# Patient Record
Sex: Female | Born: 1985
Health system: Southern US, Community
[De-identification: ages and names within clinical notes are randomized; demographics above are authoritative.]

## PROBLEM LIST (undated history)

## (undated) DIAGNOSIS — Z789 Other specified health status: Secondary | ICD-10-CM

## (undated) DIAGNOSIS — M7500 Adhesive capsulitis of unspecified shoulder: Secondary | ICD-10-CM

## (undated) DIAGNOSIS — O1423 HELLP syndrome (HELLP), third trimester: Secondary | ICD-10-CM

## (undated) HISTORY — DX: Adhesive capsulitis of unspecified shoulder: M75.00

## (undated) HISTORY — PX: CRYOTHERAPY: SHX1416

---

## 2015-06-28 LAB — OB RESULTS CONSOLE RPR: RPR: NONREACTIVE

## 2015-06-28 LAB — OB RESULTS CONSOLE ABO/RH: RH Type: POSITIVE

## 2015-06-28 LAB — OB RESULTS CONSOLE HIV ANTIBODY (ROUTINE TESTING): HIV: NONREACTIVE

## 2015-06-28 LAB — OB RESULTS CONSOLE RUBELLA ANTIBODY, IGM: Rubella: IMMUNE

## 2015-06-28 LAB — OB RESULTS CONSOLE HEPATITIS B SURFACE ANTIGEN: Hepatitis B Surface Ag: NEGATIVE

## 2015-12-12 ENCOUNTER — Inpatient Hospital Stay (HOSPITAL_COMMUNITY)
Admission: AD | Admit: 2015-12-12 | Discharge: 2015-12-15 | DRG: 765 | Disposition: A | Discharge status: Home / Self Care | Payer: BLUE CROSS/BLUE SHIELD | Source: Ambulatory Visit | Attending: Obstetrics & Gynecology | Admitting: Obstetrics & Gynecology

## 2015-12-12 ENCOUNTER — Inpatient Hospital Stay (HOSPITAL_COMMUNITY): Payer: BLUE CROSS/BLUE SHIELD | Admitting: Anesthesiology

## 2015-12-12 ENCOUNTER — Encounter (HOSPITAL_COMMUNITY): Payer: Self-pay | Admitting: *Deleted

## 2015-12-12 ENCOUNTER — Encounter (HOSPITAL_COMMUNITY)
Admission: AD | Disposition: A | Discharge status: Home / Self Care | Payer: Self-pay | Source: Ambulatory Visit | Attending: Obstetrics & Gynecology

## 2015-12-12 DIAGNOSIS — Z833 Family history of diabetes mellitus: Secondary | ICD-10-CM | POA: Diagnosis not present

## 2015-12-12 DIAGNOSIS — O30043 Twin pregnancy, dichorionic/diamniotic, third trimester: Secondary | ICD-10-CM | POA: Diagnosis present

## 2015-12-12 DIAGNOSIS — O328XX2 Maternal care for other malpresentation of fetus, fetus 2: Secondary | ICD-10-CM | POA: Diagnosis present

## 2015-12-12 DIAGNOSIS — O9902 Anemia complicating childbirth: Secondary | ICD-10-CM | POA: Diagnosis present

## 2015-12-12 DIAGNOSIS — O1424 HELLP syndrome, complicating childbirth: Principal | ICD-10-CM | POA: Diagnosis present

## 2015-12-12 DIAGNOSIS — Z3A35 35 weeks gestation of pregnancy: Secondary | ICD-10-CM

## 2015-12-12 DIAGNOSIS — O1423 HELLP syndrome (HELLP), third trimester: Secondary | ICD-10-CM | POA: Diagnosis present

## 2015-12-12 HISTORY — DX: Other specified health status: Z78.9

## 2015-12-12 LAB — COMPREHENSIVE METABOLIC PANEL
ALT: 266 U/L — ABNORMAL HIGH (ref 14–54)
ALT: 195 U/L — ABNORMAL HIGH (ref 14–54)
AST: 244 U/L — ABNORMAL HIGH (ref 15–41)
AST: 163 U/L — ABNORMAL HIGH (ref 15–41)
Albumin: 2.8 g/dL — ABNORMAL LOW (ref 3.5–5.0)
Albumin: 2.3 g/dL — ABNORMAL LOW (ref 3.5–5.0)
Alkaline Phosphatase: 172 U/L — ABNORMAL HIGH (ref 38–126)
Alkaline Phosphatase: 136 U/L — ABNORMAL HIGH (ref 38–126)
Anion gap: 8 (ref 5–15)
Anion gap: 9 (ref 5–15)
BUN: 9 mg/dL (ref 6–20)
BUN: 7 mg/dL (ref 6–20)
CO2: 20 mmol/L — ABNORMAL LOW (ref 22–32)
CO2: 19 mmol/L — ABNORMAL LOW (ref 22–32)
Calcium: 8.4 mg/dL — ABNORMAL LOW (ref 8.9–10.3)
Calcium: 8.1 mg/dL — ABNORMAL LOW (ref 8.9–10.3)
Chloride: 106 mmol/L (ref 101–111)
Chloride: 110 mmol/L (ref 101–111)
Creatinine, Ser: 0.76 mg/dL (ref 0.44–1.00)
Creatinine, Ser: 0.7 mg/dL (ref 0.44–1.00)
GFR calc Af Amer: >60 mL/min (ref >60)
GFR calc Af Amer: >60 mL/min (ref >60)
GFR calc non Af Amer: >60 mL/min (ref >60)
GFR calc non Af Amer: >60 mL/min (ref >60)
Glucose, Bld: 84 mg/dL (ref 65–99)
Glucose, Bld: 148 mg/dL — ABNORMAL HIGH (ref 65–99)
Potassium: 3.6 mmol/L (ref 3.5–5.1)
Potassium: 3.6 mmol/L (ref 3.5–5.1)
Sodium: 134 mmol/L — ABNORMAL LOW (ref 135–145)
Sodium: 138 mmol/L (ref 135–145)
Total Bilirubin: 0.6 mg/dL (ref 0.3–1.2)
Total Bilirubin: 0.4 mg/dL (ref 0.3–1.2)
Total Protein: 6.1 g/dL — ABNORMAL LOW (ref 6.5–8.1)
Total Protein: 5.2 g/dL — ABNORMAL LOW (ref 6.5–8.1)

## 2015-12-12 LAB — PROTEIN / CREATININE RATIO, URINE
Creatinine, Urine: 121 mg/dL
Protein Creatinine Ratio: 0.14 mg/mg{Cre} (ref 0.00–0.15)
Total Protein, Urine: 17 mg/dL

## 2015-12-12 LAB — CBC
HCT: 34.5 % — ABNORMAL LOW (ref 36.0–46.0)
HCT: 28.7 % — ABNORMAL LOW (ref 36.0–46.0)
Hemoglobin: 11.8 g/dL — ABNORMAL LOW (ref 12.0–15.0)
Hemoglobin: 9.8 g/dL — ABNORMAL LOW (ref 12.0–15.0)
MCH: 30.9 pg (ref 26.0–34.0)
MCH: 30.9 pg (ref 26.0–34.0)
MCHC: 34.2 g/dL (ref 30.0–36.0)
MCHC: 34.1 g/dL (ref 30.0–36.0)
MCV: 90.3 fL (ref 78.0–100.0)
MCV: 90.5 fL (ref 78.0–100.0)
Platelets: 61 10*3/uL — ABNORMAL LOW (ref 150–400)
Platelets: 102 10*3/uL — ABNORMAL LOW (ref 150–400)
RBC: 3.82 MIL/uL — ABNORMAL LOW (ref 3.87–5.11)
RBC: 3.17 MIL/uL — ABNORMAL LOW (ref 3.87–5.11)
RDW: 13.5 % (ref 11.5–15.5)
RDW: 13.5 % (ref 11.5–15.5)
WBC: 11 10*3/uL — ABNORMAL HIGH (ref 4.0–10.5)
WBC: 13.9 10*3/uL — ABNORMAL HIGH (ref 4.0–10.5)

## 2015-12-12 LAB — AMYLASE: Amylase: 35 U/L (ref 28–100)

## 2015-12-12 LAB — URIC ACID: Uric Acid, Serum: 6 mg/dL (ref 2.3–6.6)

## 2015-12-12 LAB — PREPARE RBC (CROSSMATCH)

## 2015-12-12 LAB — ABO/RH: ABO/RH(D): B POS

## 2015-12-12 SURGERY — Surgical Case
Anesthesia: General

## 2015-12-12 MED ORDER — CEFAZOLIN SODIUM-DEXTROSE 2-4 GM/100ML-% IV SOLN
2.0000 g | INTRAVENOUS | Status: AC
  Administered 2015-12-12: 2 g via INTRAVENOUS
  Filled 2015-12-12: qty 100

## 2015-12-12 MED ORDER — FAMOTIDINE IN NACL 20-0.9 MG/50ML-% IV SOLN
20.0000 mg | Freq: Once | INTRAVENOUS | Status: AC
  Administered 2015-12-12: 20 mg via INTRAVENOUS
  Filled 2015-12-12: qty 50

## 2015-12-12 MED ORDER — SODIUM CHLORIDE 0.9 % IR SOLN
Status: DC | PRN
  Administered 2015-12-12: 1000 mL

## 2015-12-12 MED ORDER — OXYCODONE HCL 5 MG PO TABS
5.0000 mg | ORAL_TABLET | ORAL | Status: DC | PRN
  Administered 2015-12-13 – 2015-12-15 (×5): 5 mg via ORAL
  Filled 2015-12-12 (×6): qty 1

## 2015-12-12 MED ORDER — HYDROMORPHONE HCL 1 MG/ML IJ SOLN
0.2500 mg | INTRAMUSCULAR | Status: DC | PRN
  Administered 2015-12-12 (×2): 0.5 mg via INTRAVENOUS

## 2015-12-12 MED ORDER — SODIUM CHLORIDE 0.9 % IV SOLN
INTRAVENOUS | Status: DC | PRN
  Administered 2015-12-12 (×2): via INTRAVENOUS

## 2015-12-12 MED ORDER — BETAMETHASONE SOD PHOS & ACET 6 (3-3) MG/ML IJ SUSP
12.0000 mg | Freq: Once | INTRAMUSCULAR | Status: AC
  Administered 2015-12-12: 12 mg via INTRAMUSCULAR
  Filled 2015-12-12: qty 2

## 2015-12-12 MED ORDER — SODIUM CHLORIDE 0.9 % IJ SOLN
INTRAMUSCULAR | Status: AC
  Filled 2015-12-12: qty 10

## 2015-12-12 MED ORDER — OXYTOCIN 10 UNIT/ML IJ SOLN: 2.5000 [IU]/h | INTRAVENOUS | Status: AC

## 2015-12-12 MED ORDER — HYDROMORPHONE HCL 1 MG/ML IJ SOLN
INTRAMUSCULAR | Status: AC
  Administered 2015-12-12: 0.5 mg via INTRAVENOUS
  Filled 2015-12-12: qty 1

## 2015-12-12 MED ORDER — MAGNESIUM SULFATE 50 % IJ SOLN
2.0000 g/h | INTRAVENOUS | Status: DC
  Filled 2015-12-12: qty 80

## 2015-12-12 MED ORDER — FENTANYL CITRATE (PF) 250 MCG/5ML IJ SOLN
INTRAMUSCULAR | Status: AC
  Filled 2015-12-12: qty 5

## 2015-12-12 MED ORDER — CITRIC ACID-SODIUM CITRATE 334-500 MG/5ML PO SOLN
30.0000 mL | Freq: Once | ORAL | Status: AC
  Administered 2015-12-12: 30 mL via ORAL
  Filled 2015-12-12: qty 15

## 2015-12-12 MED ORDER — CARBOPROST TROMETHAMINE 250 MCG/ML IM SOLN
INTRAMUSCULAR | Status: DC | PRN
  Administered 2015-12-12: 250 ug via INTRAMUSCULAR

## 2015-12-12 MED ORDER — DIBUCAINE 1 % RE OINT: 1.0000 "application " | TOPICAL_OINTMENT | RECTAL | Status: DC | PRN

## 2015-12-12 MED ORDER — OXYTOCIN 10 UNIT/ML IJ SOLN
INTRAMUSCULAR | Status: AC
  Filled 2015-12-12: qty 4

## 2015-12-12 MED ORDER — SCOPOLAMINE 1 MG/3DAYS TD PT72
MEDICATED_PATCH | TRANSDERMAL | Status: AC
  Filled 2015-12-12: qty 1

## 2015-12-12 MED ORDER — DEXAMETHASONE SODIUM PHOSPHATE 4 MG/ML IJ SOLN
INTRAMUSCULAR | Status: AC
  Filled 2015-12-12: qty 1

## 2015-12-12 MED ORDER — OXYTOCIN 10 UNIT/ML IJ SOLN
INTRAMUSCULAR | Status: AC
  Filled 2015-12-12: qty 1

## 2015-12-12 MED ORDER — PROPOFOL 10 MG/ML IV BOLUS
INTRAVENOUS | Status: DC | PRN
  Administered 2015-12-12: 150 mg via INTRAVENOUS

## 2015-12-12 MED ORDER — WITCH HAZEL-GLYCERIN EX PADS: 1.0000 "application " | MEDICATED_PAD | CUTANEOUS | Status: DC | PRN

## 2015-12-12 MED ORDER — ONDANSETRON HCL 4 MG/2ML IJ SOLN
INTRAMUSCULAR | Status: DC | PRN
  Administered 2015-12-12: 4 mg via INTRAVENOUS

## 2015-12-12 MED ORDER — SCOPOLAMINE 1 MG/3DAYS TD PT72
MEDICATED_PATCH | TRANSDERMAL | Status: DC | PRN
  Administered 2015-12-12: 1 via TRANSDERMAL

## 2015-12-12 MED ORDER — MEPERIDINE HCL 25 MG/ML IJ SOLN: 6.2500 mg | INTRAMUSCULAR | Status: DC | PRN

## 2015-12-12 MED ORDER — MAGNESIUM SULFATE BOLUS VIA INFUSION
4.0000 g | Freq: Once | INTRAVENOUS | Status: AC
  Administered 2015-12-12: 4 g via INTRAVENOUS
  Filled 2015-12-12: qty 500

## 2015-12-12 MED ORDER — SUCCINYLCHOLINE CHLORIDE 20 MG/ML IJ SOLN
INTRAMUSCULAR | Status: DC | PRN
  Administered 2015-12-12: 140 mg via INTRAVENOUS

## 2015-12-12 MED ORDER — MENTHOL 3 MG MT LOZG
1.0000 | LOZENGE | OROMUCOSAL | Status: DC | PRN
  Filled 2015-12-12: qty 9

## 2015-12-12 MED ORDER — SENNOSIDES-DOCUSATE SODIUM 8.6-50 MG PO TABS
2.0000 | ORAL_TABLET | ORAL | Status: DC
  Administered 2015-12-12: 2 via ORAL
  Filled 2015-12-12 (×3): qty 2

## 2015-12-12 MED ORDER — IBUPROFEN 600 MG PO TABS
600.0000 mg | ORAL_TABLET | Freq: Four times a day (QID) | ORAL | Status: DC
  Administered 2015-12-12 – 2015-12-15 (×9): 600 mg via ORAL
  Filled 2015-12-12 (×10): qty 1

## 2015-12-12 MED ORDER — LIDOCAINE HCL (CARDIAC) 20 MG/ML IV SOLN
INTRAVENOUS | Status: AC
  Filled 2015-12-12: qty 5

## 2015-12-12 MED ORDER — LANOLIN HYDROUS EX OINT: 1.0000 "application " | TOPICAL_OINTMENT | CUTANEOUS | Status: DC | PRN

## 2015-12-12 MED ORDER — FENTANYL CITRATE (PF) 100 MCG/2ML IJ SOLN
INTRAMUSCULAR | Status: DC | PRN
  Administered 2015-12-12 (×3): 50 ug via INTRAVENOUS
  Administered 2015-12-12: 100 ug via INTRAVENOUS

## 2015-12-12 MED ORDER — TETANUS-DIPHTH-ACELL PERTUSSIS 5-2.5-18.5 LF-MCG/0.5 IM SUSP
0.5000 mL | Freq: Once | INTRAMUSCULAR | Status: DC
Start: 2015-12-13 — End: 2015-12-14
  Filled 2015-12-12: qty 0.5

## 2015-12-12 MED ORDER — LACTATED RINGERS IV SOLN
INTRAVENOUS | Status: DC
  Administered 2015-12-12: 14:00:00 via INTRAVENOUS

## 2015-12-12 MED ORDER — SIMETHICONE 80 MG PO CHEW: 80.0000 mg | CHEWABLE_TABLET | ORAL | Status: DC | PRN

## 2015-12-12 MED ORDER — LACTATED RINGERS IV SOLN
INTRAVENOUS | Status: DC
Start: 2015-12-12 — End: 2015-12-14
  Administered 2015-12-12 – 2015-12-13 (×2): 125 mL/h via INTRAVENOUS

## 2015-12-12 MED ORDER — MIDAZOLAM HCL 2 MG/2ML IJ SOLN
INTRAMUSCULAR | Status: AC
  Filled 2015-12-12: qty 2

## 2015-12-12 MED ORDER — ONDANSETRON HCL 4 MG/2ML IJ SOLN
INTRAMUSCULAR | Status: AC
  Filled 2015-12-12: qty 2

## 2015-12-12 MED ORDER — OXYTOCIN 10 UNIT/ML IJ SOLN
40.0000 [IU] | INTRAVENOUS | Status: DC | PRN
  Administered 2015-12-12: 40 [IU] via INTRAVENOUS

## 2015-12-12 MED ORDER — DIPHENHYDRAMINE HCL 25 MG PO CAPS
25.0000 mg | ORAL_CAPSULE | Freq: Four times a day (QID) | ORAL | Status: DC | PRN
  Filled 2015-12-12: qty 1

## 2015-12-12 MED ORDER — DEXAMETHASONE SODIUM PHOSPHATE 10 MG/ML IJ SOLN
INTRAMUSCULAR | Status: DC | PRN
  Administered 2015-12-12: 4 mg via INTRAVENOUS

## 2015-12-12 MED ORDER — HYDROMORPHONE HCL 1 MG/ML IJ SOLN
0.5000 mg | INTRAMUSCULAR | Status: AC | PRN
  Administered 2015-12-13: 0.5 mg via INTRAVENOUS
  Filled 2015-12-12: qty 1

## 2015-12-12 MED ORDER — PRENATAL MULTIVITAMIN CH
1.0000 | ORAL_TABLET | Freq: Every day | ORAL | Status: DC
  Administered 2015-12-13 – 2015-12-14 (×2): 1 via ORAL
  Filled 2015-12-12 (×2): qty 1

## 2015-12-12 MED ORDER — LACTATED RINGERS IV BOLUS (SEPSIS)
1000.0000 mL | Freq: Once | INTRAVENOUS | Status: AC
  Administered 2015-12-12: 1000 mL via INTRAVENOUS

## 2015-12-12 MED ORDER — SIMETHICONE 80 MG PO CHEW
80.0000 mg | CHEWABLE_TABLET | Freq: Three times a day (TID) | ORAL | Status: DC
  Administered 2015-12-13 – 2015-12-15 (×8): 80 mg via ORAL
  Filled 2015-12-12 (×6): qty 1

## 2015-12-12 MED ORDER — SUCCINYLCHOLINE CHLORIDE 20 MG/ML IJ SOLN
INTRAMUSCULAR | Status: AC
  Filled 2015-12-12: qty 1

## 2015-12-12 MED ORDER — SIMETHICONE 80 MG PO CHEW
80.0000 mg | CHEWABLE_TABLET | ORAL | Status: DC
  Administered 2015-12-12: 80 mg via ORAL
  Filled 2015-12-12 (×4): qty 1

## 2015-12-12 MED ORDER — SODIUM CHLORIDE 0.9 % IV SOLN
Freq: Once | INTRAVENOUS | Status: AC
  Administered 2015-12-12: 13:00:00 via INTRAVENOUS

## 2015-12-12 MED ORDER — MIDAZOLAM HCL 2 MG/2ML IJ SOLN
INTRAMUSCULAR | Status: DC | PRN
Start: 2015-12-12 — End: 2015-12-12
  Administered 2015-12-12: 2 mg via INTRAVENOUS

## 2015-12-12 MED ORDER — LACTATED RINGERS IV SOLN
INTRAVENOUS | Status: DC | PRN
  Administered 2015-12-12: 14:00:00 via INTRAVENOUS

## 2015-12-12 MED ORDER — ZOLPIDEM TARTRATE 5 MG PO TABS: 5.0000 mg | ORAL_TABLET | Freq: Every evening | ORAL | Status: DC | PRN

## 2015-12-12 MED ORDER — PROPOFOL 10 MG/ML IV BOLUS
INTRAVENOUS | Status: AC
  Filled 2015-12-12: qty 20

## 2015-12-12 SURGICAL SUPPLY — 37 items
BENZOIN TINCTURE PRP APPL 2/3 (GAUZE/BANDAGES/DRESSINGS) ×2 IMPLANT
CHLORAPREP W/TINT 26ML (MISCELLANEOUS) ×2 IMPLANT
CLAMP CORD UMBIL (MISCELLANEOUS) IMPLANT
CLOTH BEACON ORANGE TIMEOUT ST (SAFETY) ×2 IMPLANT
CONTAINER PREFILL 10% NBF 15ML (MISCELLANEOUS) IMPLANT
DRSG OPSITE POSTOP 4X10 (GAUZE/BANDAGES/DRESSINGS) ×2 IMPLANT
ELECT REM PT RETURN 9FT ADLT (ELECTROSURGICAL) ×2
ELECTRODE REM PT RTRN 9FT ADLT (ELECTROSURGICAL) ×1 IMPLANT
EXTRACTOR VACUUM KIWI (MISCELLANEOUS) IMPLANT
EXTRACTOR VACUUM M CUP 4 TUBE (SUCTIONS) IMPLANT
GLOVE BIO SURGEON STRL SZ7 (GLOVE) ×2 IMPLANT
GLOVE BIOGEL PI IND STRL 7.0 (GLOVE) ×3 IMPLANT
GLOVE BIOGEL PI INDICATOR 7.0 (GLOVE) ×3
GOWN STRL REUS W/TWL LRG LVL3 (GOWN DISPOSABLE) ×4 IMPLANT
KIT ABG SYR 3ML LUER SLIP (SYRINGE) IMPLANT
NEEDLE HYPO 25X5/8 SAFETYGLIDE (NEEDLE) IMPLANT
NS IRRIG 1000ML POUR BTL (IV SOLUTION) ×2 IMPLANT
PACK C SECTION WH (CUSTOM PROCEDURE TRAY) ×2 IMPLANT
PAD ABD 8X7 1/2 STERILE (GAUZE/BANDAGES/DRESSINGS) ×2 IMPLANT
PAD OB MATERNITY 4.3X12.25 (PERSONAL CARE ITEMS) ×2 IMPLANT
RTRCTR C-SECT PINK 25CM LRG (MISCELLANEOUS) ×2 IMPLANT
SPONGE GAUZE 4X4 12PLY STER LF (GAUZE/BANDAGES/DRESSINGS) ×4 IMPLANT
STRIP CLOSURE SKIN 1/2X4 (GAUZE/BANDAGES/DRESSINGS) ×2 IMPLANT
SUT MON AB-0 CT1 36 (SUTURE) ×8 IMPLANT
SUT PLAIN 0 NONE (SUTURE) IMPLANT
SUT PLAIN 2 0 (SUTURE) ×1
SUT PLAIN 2 0 XLH (SUTURE) ×2 IMPLANT
SUT PLAIN ABS 2-0 CT1 27XMFL (SUTURE) ×1 IMPLANT
SUT VIC AB 0 CT1 27 (SUTURE) ×2
SUT VIC AB 0 CT1 27XBRD ANBCTR (SUTURE) ×2 IMPLANT
SUT VIC AB 2-0 CT1 27 (SUTURE) ×3
SUT VIC AB 2-0 CT1 TAPERPNT 27 (SUTURE) ×3 IMPLANT
SUT VIC AB 4-0 KS 27 (SUTURE) IMPLANT
SUT VICRYL 0 TIES 12 18 (SUTURE) IMPLANT
TAPE CLOTH SURG 4X10 WHT LF (GAUZE/BANDAGES/DRESSINGS) ×2 IMPLANT
TOWEL OR 17X24 6PK STRL BLUE (TOWEL DISPOSABLE) ×2 IMPLANT
TRAY FOLEY CATH SILVER 14FR (SET/KITS/TRAYS/PACK) IMPLANT

## 2015-12-12 NOTE — Anesthesia Procedure Notes (Signed)
Procedure Name: Intubation Date/Time: 12/12/2015 2:15 PM Performed by: Yolonda KidaARVER, Odis Turck L Pre-anesthesia Checklist: Patient identified, Emergency Drugs available, Suction available and Patient being monitored Patient Re-evaluated:Patient Re-evaluated prior to inductionOxygen Delivery Method: Circle system utilized Preoxygenation: Pre-oxygenation with 100% oxygen Intubation Type: IV induction, Rapid sequence and Cricoid Pressure applied Laryngoscope Size: Glidescope and 3 Grade View: Grade I Tube type: Oral Tube size: 7.0 mm Number of attempts: 1 Airway Equipment and Method: Stylet and Video-laryngoscopy Placement Confirmation: ETT inserted through vocal cords under direct vision,  positive ETCO2,  CO2 detector and breath sounds checked- equal and bilateral Secured at: 21 cm Tube secured with: Tape Dental Injury: Teeth and Oropharynx as per pre-operative assessment

## 2015-12-12 NOTE — MAU Note (Signed)
Sharp pains rt side lower ribcage, last few nights, off and on. Shooting up into chest.  Are calming down now. Spotting noted with internal.

## 2015-12-12 NOTE — MAU Note (Signed)
House coverage and OR -coordinator notified, Data processing managercirculator and anes

## 2015-12-12 NOTE — Transfer of Care (Signed)
Immediate Anesthesia Transfer of Care Note  Patient: Lauren Robertson  Procedure(s) Performed: Procedure(s): CESAREAN SECTION (N/A)  Patient Location: PACU  Anesthesia Type:General  Level of Consciousness: awake, alert  and oriented  Airway & Oxygen Therapy: Patient Spontanous Breathing and Patient connected to nasal cannula oxygen  Post-op Assessment: Report given to RN and Post -op Vital signs reviewed and stable  Post vital signs: Reviewed and stable  Last Vitals:  Filed Vitals:   12/12/15 1332 12/12/15 1352  BP: 135/82 142/84  Pulse: 96 105  Temp: 37.2 C 37.6 C  Resp: 18 18    Complications: No apparent anesthesia complications

## 2015-12-12 NOTE — Progress Notes (Addendum)
[facility] from [facility] - assessment reviewed / seen in office by Dr Deatra JamesMody  Contacted Dr Juliene PinaMody - [facility]  Notify MD on-call with results   Marlinda Mikeanya Abdifatah Colquhoun CNM, [facility]

## 2015-12-12 NOTE — MAU Note (Signed)
Spoke with Dr. Cherly Hensenousins, she is in OR and cannot see patient at this time.  Dr. Cherly Hensenousins states for me to call Dr. Juliene PinaMody with results.

## 2015-12-12 NOTE — Op Note (Signed)
Cesarean Section Procedure Note  Cierah P Mogel  12/12/2015  Indications: HELLP Syndrome, Dichorionic twins, 35 weeks    Procedure: Primary Low Transverse Cesarean section                     (Double layer closure of hysterotomy)  Pre-operative Diagnosis: HELLP Syndrome, Ttwins, 35 weeks .   Post-operative Diagnosis: Same   Surgeon:  Shea EvansVaishali Iven Earnhart, MD   Assistants: Marlinda Mikeanya Bailey, CNM   Anesthesia: General endotracheal  Procedure Details:  The patient was seen in the Triage Room, she was sent over from office. She is 35 wks with Diamniotic/Dichorionic twins who was seen in the office for increased swelling, not feeling well,right chest shooting pain with contractions. Office BP was 142/72 and was sent over for Preeclampsia evaluation and to monitor for preterm labor. She was diagnosed with HELLP syndrome with elevated liver enzymes in 200s and Platelet count at 62. She was remote from delivery with no prior vaginal deliveries and expedited delivery was indicated. She was counseled on platelet transfusion and Cesarean delivery. She was given one dose of Betamethasone for fetal lung maturity/  The risks, benefits, complications, treatment options, and expected outcomes were discussed with the patient. The patient concurred with the proposed plan, giving informed consent. identified as Yuritzi P Varon and the procedure verified as C-Section Delivery.  She was brought to the Operating room. A Time Out was held and the above information confirmed. One platelet unit transfusion started and 2nd was available that was transfused as the surgery ended. 2 gm Ancef started. She had foley placed and prepping and draping was done. Then she underwent general anesthesia and intubation was done as which point I was asked to start surgery.  A Pfannenstiel incision was made and carried down through the subcutaneous tissue to the fascia. Fascial incision was made and extended transversely. The fascia was separated  from the underlying rectus tissue superiorly and inferiorly. The peritoneum was identified and entered. Peritoneal incision was extended longitudinally. Alexis-O tractor was placed The utero-vesical peritoneal reflection was incised transversely and the bladder flap was bluntly freed from the lower uterine segment. A low transverse uterine incision was made. Baby A- BOY was cephalic. Amniotomy revealed clear fluid. Baby A was delivered from cephalic presentation, cord clamped and cut and baby handed to NICU team. Apgar scores of 8 at one minute and 9 at five minutes. Twin B was Footling breech. Amniotomy done and clear copious fluid drained. Twin B- GIRL was delivered via complete breech extraction, cord clamped and cut and baby handed to NICU team. Apgars 7 and 9 at 1 and 5 minutes. Cord ph was not sent. Umbilical cord blood was obtained for evaluation after placentas removed. The placenta was removed Intact and appeared normal. The uterine outline, tubes and ovaries appeared normal. Patient was given 1 dose of IM Hemabate to prevent uterine atony from general anesthesia and twins. The uterine incision was closed with running locked sutures of 0Monocryl followed by a second imbricating layer. Hemostasis was observed. Alexis retractor removed. Peritoneal closure done with 2-0 Vicryl. The fascia was then reapproximated with running sutures of 0Vicryl. The subcuticular closure was performed using 2-0plain gut. The skin was closed with 4-0Vicryl. Pressure dressing and sterile dressings placed.   Instrument, sponge, and needle counts were correct prior the abdominal closure and were correct at the conclusion of the case.   Findings:  Low transverse C-section, 2 layer closure. Twin A - BOY, cephalic delivery. Twin B -  girl, breech extraction. Clear amniotic fluid for each. Normal placentas. Normal uterus, tubes, ovaries.    Estimated Blood Loss: 1200 cc   Total IV Fluids: 3500 cc LR  Urine Output: 150CC OF  clear urine  Specimens: cord blood, placenta   Complications: no complications  Disposition: PACU - hemodynamically stable.   Maternal Condition: stable but transfer to ICU for magnesium and close monitoring   Baby condition / location:  Couplet care / Skin to Skin  Attending Attestation: I performed the procedure.   Signed: Surgeon(s): Shea Evans, MD

## 2015-12-12 NOTE — Anesthesia Preprocedure Evaluation (Signed)
Anesthesia Evaluation  Patient identified by MRN, date of birth, ID band Patient awake    Reviewed: Allergy & Precautions, NPO status , Patient's Chart, lab work & pertinent test results  Airway Mallampati: II  TM Distance: >3 FB Neck ROM: Full    Dental  (+) Teeth Intact, Dental Advisory Given   Pulmonary neg pulmonary ROS,    Pulmonary exam normal        Cardiovascular Normal cardiovascular exam     Neuro/Psych negative neurological ROS  negative psych ROS   GI/Hepatic negative GI ROS, Neg liver ROS,   Endo/Other  negative endocrine ROS  Renal/GU negative Renal ROS     Musculoskeletal   Abdominal   Peds  Hematology negative hematology ROS (+)   Anesthesia Other Findings   Reproductive/Obstetrics (+) Pregnancy                             Anesthesia Physical Anesthesia Plan  ASA: III and emergent  Anesthesia Plan: General   Post-op Pain Management:    Induction: Intravenous  Airway Management Planned: Oral ETT  Additional Equipment:   Intra-op Plan:   Post-operative Plan: Extubation in OR  Informed Consent: I have reviewed the patients History and Physical, chart, labs and discussed the procedure including the risks, benefits and alternatives for the proposed anesthesia with the patient or authorized representative who has indicated his/her understanding and acceptance.   Dental advisory given  Plan Discussed with: CRNA and Anesthesiologist  Anesthesia Plan Comments:         Anesthesia Quick Evaluation

## 2015-12-12 NOTE — Anesthesia Postprocedure Evaluation (Signed)
Anesthesia Post Note  Patient: Lauren Robertson  Procedure(s) Performed: Procedure(s) (LRB): CESAREAN SECTION (N/A)  Patient location during evaluation: PACU Anesthesia Type: General Level of consciousness: sedated Pain management: pain level controlled Vital Signs Assessment: post-procedure vital signs reviewed and stable Respiratory status: spontaneous breathing and respiratory function stable Cardiovascular status: stable Anesthetic complications: no    Last Vitals:  Filed Vitals:   12/12/15 1545 12/12/15 1600  BP: 139/78 118/93  Pulse: 65 73  Temp:    Resp: 13 18    Last Pain:  Filed Vitals:   12/12/15 1605  PainSc: 5                  Alekai Pocock DANIEL

## 2015-12-12 NOTE — H&P (Signed)
Carmeline P Darling is a 30 y.o. female presenting for RUQ pain, swelling, elevated BP. MAU eval done, pt has HELLP syndrome.  DiDi twins, Femara preg. Uncomplicated. Last growth sono at 34 wks, both about 6 lbs.  Healthy mother.   History OB History    Gravida Para Term Preterm AB TAB SAB Ectopic Multiple Living   1              Past Medical History  Diagnosis Date  . Medical history non-contributory    Past Surgical History  Procedure Laterality Date  . Cryotherapy      cervix   Family History: family history includes Cancer in her paternal grandmother; Diabetes in her paternal grandmother. Social History:  reports that she has never smoked. She does not have any smokeless tobacco history on file. She reports that she does not drink alcohol or use illicit drugs.   Prenatal Transfer Tool  Maternal Diabetes: No Genetic Screening: Normal Maternal Ultrasounds/Referrals: Normal Fetal Ultrasounds or other Referrals:  None Maternal Substance Abuse:  No Significant Maternal Medications:  None Significant Maternal Lab Results:  Lab values include: Group B Strep negative Other Comments:  None  ROS above    Blood pressure 134/74, pulse 82, temperature 98.9 F (37.2 C), temperature source Oral, resp. rate 20, weight 210 lb (95.255 kg), SpO2 97 %.  Exam Physical Exam  A&O x 3, no acute distress. Pleasant HEENT neg, no thyromegaly Lungs CTA bilat CV RRR, S1S2 normal Abdo soft, non tender, non acute Extr +3 edema/ no tenderness/ DTR +2 Pelvic 2/50%/-2 FHT A 130s/ reactive, cateogory I, B 140s/ reactive, category I Toco irreg UCs  Prenatal labs: ABO, Rh: --/--/B POS (04/03 1049) Antibody: PENDING (04/03 1049) Rubella:   RPR:   NR HBsAg:   Neg HIV:   Neg GBS:   Neg  Assessment/Plan: Mercie Eoni Di twins, 35 wks, HELLP syndrome. Platelet pack 1 unit ASAP and C/section ASAP. 2nd platelet pack in OR if needed. T&C ready.  BMTZ stat one dose. NICU informed, accept Reviewed severe  PEC/HELLP, risks/complications, accepts transfusion/   Risks/complications of surgery reviewed incl infection, bleeding, damage to internal organs including bladder, bowels, ureters, blood vessels, other risks from anesthesia, VTE and delayed complications of any surgery, complications in future surgery reviewed. Also discussed neonatal complications incl difficult delivery, laceration, vacuum assistance, TTN etc. Pt understands and agrees, all concerns addressed.      Vonya Ohalloran R 12/12/2015, 12:52 PM

## 2015-12-13 ENCOUNTER — Encounter (HOSPITAL_COMMUNITY): Payer: Self-pay | Admitting: *Deleted

## 2015-12-13 LAB — COMPREHENSIVE METABOLIC PANEL
ALT: 154 U/L — ABNORMAL HIGH (ref 14–54)
ALT: 153 U/L — ABNORMAL HIGH (ref 14–54)
AST: 107 U/L — ABNORMAL HIGH (ref 15–41)
AST: 87 U/L — ABNORMAL HIGH (ref 15–41)
Albumin: 2.1 g/dL — ABNORMAL LOW (ref 3.5–5.0)
Albumin: 2.5 g/dL — ABNORMAL LOW (ref 3.5–5.0)
Alkaline Phosphatase: 126 U/L (ref 38–126)
Alkaline Phosphatase: 137 U/L — ABNORMAL HIGH (ref 38–126)
Anion gap: 6 (ref 5–15)
Anion gap: 6 (ref 5–15)
BUN: 7 mg/dL (ref 6–20)
BUN: 8 mg/dL (ref 6–20)
CO2: 23 mmol/L (ref 22–32)
CO2: 24 mmol/L (ref 22–32)
Calcium: 7.7 mg/dL — ABNORMAL LOW (ref 8.9–10.3)
Calcium: 7.5 mg/dL — ABNORMAL LOW (ref 8.9–10.3)
Chloride: 109 mmol/L (ref 101–111)
Chloride: 108 mmol/L (ref 101–111)
Creatinine, Ser: 0.79 mg/dL (ref 0.44–1.00)
Creatinine, Ser: 0.75 mg/dL (ref 0.44–1.00)
GFR calc Af Amer: >60 mL/min (ref >60)
GFR calc Af Amer: >60 mL/min (ref >60)
GFR calc non Af Amer: >60 mL/min (ref >60)
GFR calc non Af Amer: >60 mL/min (ref >60)
Glucose, Bld: 108 mg/dL — ABNORMAL HIGH (ref 65–99)
Glucose, Bld: 106 mg/dL — ABNORMAL HIGH (ref 65–99)
Potassium: 4.3 mmol/L (ref 3.5–5.1)
Potassium: 4.2 mmol/L (ref 3.5–5.1)
Sodium: 138 mmol/L (ref 135–145)
Sodium: 138 mmol/L (ref 135–145)
Total Bilirubin: 0.4 mg/dL (ref 0.3–1.2)
Total Bilirubin: 0.2 mg/dL — ABNORMAL LOW (ref 0.3–1.2)
Total Protein: 4.8 g/dL — ABNORMAL LOW (ref 6.5–8.1)
Total Protein: 5.8 g/dL — ABNORMAL LOW (ref 6.5–8.1)

## 2015-12-13 LAB — CBC
HCT: 26.4 % — ABNORMAL LOW (ref 36.0–46.0)
HCT: 27.1 % — ABNORMAL LOW (ref 36.0–46.0)
Hemoglobin: 9.1 g/dL — ABNORMAL LOW (ref 12.0–15.0)
Hemoglobin: 9.3 g/dL — ABNORMAL LOW (ref 12.0–15.0)
MCH: 31 pg (ref 26.0–34.0)
MCH: 31.1 pg (ref 26.0–34.0)
MCHC: 34.5 g/dL (ref 30.0–36.0)
MCHC: 34.3 g/dL (ref 30.0–36.0)
MCV: 89.8 fL (ref 78.0–100.0)
MCV: 90.6 fL (ref 78.0–100.0)
Platelets: 126 10*3/uL — ABNORMAL LOW (ref 150–400)
Platelets: 149 10*3/uL — ABNORMAL LOW (ref 150–400)
RBC: 2.94 MIL/uL — ABNORMAL LOW (ref 3.87–5.11)
RBC: 2.99 MIL/uL — ABNORMAL LOW (ref 3.87–5.11)
RDW: 13.7 % (ref 11.5–15.5)
RDW: 13.8 % (ref 11.5–15.5)
WBC: 14.6 10*3/uL — ABNORMAL HIGH (ref 4.0–10.5)
WBC: 14.9 10*3/uL — ABNORMAL HIGH (ref 4.0–10.5)

## 2015-12-13 LAB — RPR: RPR Ser Ql: NONREACTIVE

## 2015-12-13 LAB — PREPARE PLATELET PHERESIS
Unit division: 0
Unit division: 0

## 2015-12-13 LAB — MAGNESIUM: Magnesium: 4.6 mg/dL — ABNORMAL HIGH (ref 1.7–2.4)

## 2015-12-13 MED ORDER — SCOPOLAMINE 1 MG/3DAYS TD PT72
1.0000 | MEDICATED_PATCH | Freq: Once | TRANSDERMAL | Status: DC
  Filled 2015-12-13: qty 1

## 2015-12-13 MED ORDER — NALBUPHINE HCL 10 MG/ML IJ SOLN: 5.0000 mg | Freq: Once | INTRAMUSCULAR | Status: DC | PRN

## 2015-12-13 MED ORDER — NALBUPHINE HCL 10 MG/ML IJ SOLN: 5.0000 mg | INTRAMUSCULAR | Status: DC | PRN

## 2015-12-13 MED ORDER — DIPHENHYDRAMINE HCL 50 MG/ML IJ SOLN: 12.5000 mg | INTRAMUSCULAR | Status: DC | PRN

## 2015-12-13 MED ORDER — NALOXONE HCL 0.4 MG/ML IJ SOLN: 0.4000 mg | INTRAMUSCULAR | Status: DC | PRN

## 2015-12-13 MED ORDER — SODIUM CHLORIDE 0.9% FLUSH: 3.0000 mL | INTRAVENOUS | Status: DC | PRN

## 2015-12-13 MED ORDER — KETOROLAC TROMETHAMINE 30 MG/ML IJ SOLN
30.0000 mg | Freq: Four times a day (QID) | INTRAMUSCULAR | Status: DC | PRN
  Filled 2015-12-13: qty 1

## 2015-12-13 MED ORDER — NALOXONE HCL 2 MG/2ML IJ SOSY
1.0000 ug/kg/h | PREFILLED_SYRINGE | INTRAVENOUS | Status: DC | PRN
  Filled 2015-12-13: qty 2

## 2015-12-13 MED ORDER — DIPHENHYDRAMINE HCL 25 MG PO CAPS: 25.0000 mg | ORAL_CAPSULE | ORAL | Status: DC | PRN

## 2015-12-13 MED ORDER — ONDANSETRON HCL 4 MG/2ML IJ SOLN: 4.0000 mg | Freq: Three times a day (TID) | INTRAMUSCULAR | Status: DC | PRN

## 2015-12-13 NOTE — Progress Notes (Signed)
Patient ID: Lauren Robertson, female   DOB: 02/21/1986, 30 y.o.   MRN: [id number]  POD#1 C/section for HELLP Syndrome, 35 wks, Twins Twins - Boy/Girl - with mother.  Breast feeding  Rubella Immune   Subjective: Incisional pain, was dizzy when first got out. Swelling reduced. No HA/ SOB/ CP/ RUQ pain  Tolerated diet and has ambulated to BR to clean up. No side effects from magnesium  Objective: Vital signs in last 24 hours: Temp:  [97.6 F (36.4 C)-99.7 F (37.6 C)] 98.7 F (37.1 C) (04/04 0200) Pulse Rate:  [52-107] 71 (04/04 0703) Resp:  [12-28] 14 (04/04 0703) BP: (95-142)/(48-93) 104/70 mmHg (04/04 0703) SpO2:  [91 %-100 %] 95 % (04/04 0703) Weight:  [210 lb (95.255 kg)] 210 lb (95.255 kg) (04/03 1800) Weight change:   198 today, 12 lb wt loss since yesterday.   BP 104/70 mmHg  Pulse 71  Temp(Src) 98.7 F (37.1 C) (Oral)  Resp 14  Ht 5\' 2"  (1.575 m)  Wt 198 lb 3.2 oz (89.903 kg)  BMI 36.24 kg/m2  SpO2 95%  Breastfeeding? Unknown Intake/Output from previous day: 04/03 0701 - 04/04 0700 In: 6939.3 [P.O.:1100; I.V.:5302.3; Blood:537] Out: 6100 [Urine:4850; Blood:1250]  Physical exam:  A&O x 3, no acute distress. Pleasant HEENT neg Lungs CTA bilat CV RRR, S1S2 normal Abdo soft, non tender, non acute Extr  Edema reduced a lot. No tenderness. DTR +1/+1 Pelvic deferred, bleeding normal lochia   Lab Results: CBC Latest Ref Rng 12/13/2015 12/12/2015 12/12/2015  WBC 4.0 - 10.5 K/uL 14.6(H) 13.9(H) 11.0(H)  Hemoglobin 12.0 - 15.0 g/dL 4.0(J9.1(L) 8.1(X9.8(L) 11.8(L)  Hematocrit 36.0 - 46.0 % 26.4(L) 28.7(L) 34.5(L)  Platelets 150 - 400 K/uL 126(L) 102(L) 61(L)   CMP Latest Ref Rng 12/13/2015 12/12/2015 12/12/2015  Glucose 65 - 99 mg/dL 914(N108(H) 829(F148(H) 84  BUN 6 - 20 mg/dL 7 7 9   Creatinine 0.44 - 1.00 mg/dL 6.210.79 3.080.70 6.570.76  Sodium 135 - 145 mmol/L 138 138 134(L)  Potassium 3.5 - 5.1 mmol/L 4.3 3.6 3.6  Chloride 101 - 111 mmol/L 109 110 106  CO2 22 - 32 mmol/L 23 19(L) 20(L)  Calcium 8.9  - 10.3 mg/dL 7.7(L) 8.1(L) 8.4(L)  Total Protein 6.5 - 8.1 g/dL 4.8(L) 5.2(L) 6.1(L)  Total Bilirubin 0.3 - 1.2 mg/dL 0.4 0.4 0.6  Alkaline Phos 38 - 126 U/L 126 136(H) 172(H)  AST 15 - 41 U/L 107(H) 163(H) 244(H)  ALT 14 - 54 U/L 154(H) 195(H) 266(H)    Assessment/Plan: Post c/section #1.  1) HELLP Syndrome -  s/p 2 packs of platelets intra-op. 1 BTMZ pre-op for FLM. Labs improving, continue q 24 hr checks since stable. Magnesium at 2 gm/hr rate, excellent diuresis noted, will continue till 24 hrs and reassess then if she needs to stay on BPs are in <145/95 range, no antiHTN med needed.  2) Anemia - Iron once reg diet tolerated 3) Post-op- PO Oxycodone and Ibuprofen. No tylenol until LFT improve, keep foley until magnesium 4) Post-partum -lactation consult   Lauren Robertson 12/13/2015, 7:50 AM

## 2015-12-13 NOTE — Lactation Note (Signed)
This note was copied from a baby's chart. Lactation Consultation Note  Patient Name: Lauren Robertson ZOXWR'UToday's Date: 12/13/2015 Reason for consult: Follow-up assessment;Late preterm infant;Multiple gestation    Follow up consul with this mom and dad of 35 weeks twins, now 824 hours old. Mom has been pumping every 3 hours,and getting drops of colostrum. I reviewed hand expression with her, and she was able to express about 0.5 ml, which was divided and fed to the babies. I will go back around 5 pm today, and show mom how to apply nipple shield, and try and latch the babies. Mom aware they will not transfer much at the breast(PLI), but latching is good stimulation aor both mom and babies.    Maternal Data    Feeding Feeding Type: Bottle Fed - Formula Nipple Type: Slow - flow  LATCH Score/Interventions                      Lactation Tools Discussed/Used     Consult Status Consult Status: Follow-up Date: 12/13/15 Follow-up type: In-patient    Alfred LevinsLee, Kal Chait Anne 12/13/2015, 2:51 PM

## 2015-12-13 NOTE — Addendum Note (Signed)
Addendum  created 12/13/15 1316 by Yolonda KidaAlison L Jaszmine Navejas, CRNA   Modules edited: Clinical Notes   Clinical Notes:  File: [id number]

## 2015-12-13 NOTE — Anesthesia Postprocedure Evaluation (Signed)
Anesthesia Post Note  Patient: Lauren Robertson  Procedure(s) Performed: Procedure(s) (LRB): CESAREAN SECTION (N/A)  Patient location during evaluation: Antenatal Anesthesia Type: General Level of consciousness: awake, awake and alert, oriented and patient cooperative Pain management: pain level controlled Vital Signs Assessment: post-procedure vital signs reviewed and stable Respiratory status: spontaneous breathing, nonlabored ventilation and respiratory function stable Cardiovascular status: stable Postop Assessment: no headache, no backache, no signs of nausea or vomiting and patient able to bend at knees Anesthetic complications: no    Last Vitals:  Filed Vitals:   12/13/15 1100 12/13/15 1157  BP:  129/53  Pulse:  80  Temp:  36.4 C  Resp: 18 16    Last Pain:  Filed Vitals:   12/13/15 1206  PainSc: 5                  [name] L

## 2015-12-13 NOTE — Lactation Note (Signed)
This note was copied from a baby's chart. Lactation Consultation Note  Patient Name: Lauren Robertson Nithya Ryden ZOXWR'UToday's Date: 12/13/2015 Reason for consult: Follow-up assessment   With this mom of twins, now 3526 hours old, and 35 1/7 weeks CGA. [name], Baby B is small, weighing under 5 pounds. I showed mom how to apply nipple shiled. We tried a 20, but it would not stay, so then we applied a 16, and this stayed on. I filled ti with aformula, and latched [name], and she suckled for a few time, and then slept. LPI behavior reviewed, and how breast feeding for [name] a t this time was mostly not-nutritive. Mom then bottle fed the baby. Sidelying and alignment of baby shown to mom. Formula earlier to day was Alimentum 20 cal, and was switched to Neosure 22 cal. Mom appears very tired, but has not been able to rest. If the parents agreed, I think it would be good for the babies to spend some time in CNS tonight, so the parents can rest some.    Maternal Data    Feeding Feeding Type: Bottle Fed - Formula Nipple Type: Slow - flow  LATCH Score/Interventions Latch: Repeated attempts needed to sustain latch, nipple held in mouth throughout feeding, stimulation needed to elicit sucking reflex. (baby latched with 16 nipple shield, filled with formula) Intervention(s): Assist with latch  Audible Swallowing: A few with stimulation (of formula)  Type of Nipple: Flat  Comfort (Breast/Nipple): Soft / non-tender     Hold (Positioning): Assistance needed to correctly position infant at breast and maintain latch. Intervention(s): Breastfeeding basics reviewed;Support Pillows;Position options  LATCH Score: 6  Lactation Tools Discussed/Used Tools: Nipple Shields Nipple shield size: 16;20 (16 stayed in and fit better)   Consult Status Consult Status: Follow-up Date: 12/14/15 Follow-up type: In-patient    Alfred LevinsLee, Dequarius Jeffries Anne 12/13/2015, 5:11 PM

## 2015-12-14 LAB — COMPREHENSIVE METABOLIC PANEL
ALT: 127 U/L — ABNORMAL HIGH (ref 14–54)
AST: 64 U/L — ABNORMAL HIGH (ref 15–41)
Albumin: 2.5 g/dL — ABNORMAL LOW (ref 3.5–5.0)
Alkaline Phosphatase: 139 U/L — ABNORMAL HIGH (ref 38–126)
Anion gap: 5 (ref 5–15)
BUN: 10 mg/dL (ref 6–20)
CO2: 26 mmol/L (ref 22–32)
Calcium: 8.3 mg/dL — ABNORMAL LOW (ref 8.9–10.3)
Chloride: 107 mmol/L (ref 101–111)
Creatinine, Ser: 0.76 mg/dL (ref 0.44–1.00)
GFR calc Af Amer: >60 mL/min (ref >60)
GFR calc non Af Amer: >60 mL/min (ref >60)
Glucose, Bld: 75 mg/dL (ref 65–99)
Potassium: 4.3 mmol/L (ref 3.5–5.1)
Sodium: 138 mmol/L (ref 135–145)
Total Bilirubin: 0.4 mg/dL (ref 0.3–1.2)
Total Protein: 5.7 g/dL — ABNORMAL LOW (ref 6.5–8.1)

## 2015-12-14 LAB — CBC
HCT: 29.4 % — ABNORMAL LOW (ref 36.0–46.0)
Hemoglobin: 9.7 g/dL — ABNORMAL LOW (ref 12.0–15.0)
MCH: 30.4 pg (ref 26.0–34.0)
MCHC: 33 g/dL (ref 30.0–36.0)
MCV: 92.2 fL (ref 78.0–100.0)
Platelets: 144 10*3/uL — ABNORMAL LOW (ref 150–400)
RBC: 3.19 MIL/uL — ABNORMAL LOW (ref 3.87–5.11)
RDW: 14.1 % (ref 11.5–15.5)
WBC: 13.4 10*3/uL — ABNORMAL HIGH (ref 4.0–10.5)

## 2015-12-14 MED ORDER — HYDROCHLOROTHIAZIDE 12.5 MG PO CAPS
12.5000 mg | ORAL_CAPSULE | Freq: Every day | ORAL | Status: DC
  Filled 2015-12-14: qty 1

## 2015-12-14 MED ORDER — HYDROCHLOROTHIAZIDE 12.5 MG PO CAPS
12.5000 mg | ORAL_CAPSULE | Freq: Every day | ORAL | Status: DC
  Administered 2015-12-15: 12.5 mg via ORAL
  Filled 2015-12-14: qty 1

## 2015-12-14 NOTE — Progress Notes (Addendum)
Patient ID: Clemmie KrillMalori P File, female   DOB: 04/15/1986, 30 y.o.   MRN: [id number]  POD# 2 C/section for HELLP Syndrome, 35 wks, Twins Twins - Boy/Girl - with mother.  Breast feeding  Rubella Immune   Subjective: LE swelling worse but o/w feels well.  Objective: BP 125/65 mmHg  Pulse 63  Temp(Src) 97.7 F (36.5 C) (Oral)  Resp 18  Ht 5\' 2"  (1.575 m)  Wt 198 lb 3.2 oz (89.903 kg)  BMI 36.24 kg/m2  SpO2 97%  Breastfeeding? Unknown    Temp:  [97.6 F (36.4 C)-98.2 F (36.8 C)] 97.7 F (36.5 C) (04/05 0330) Pulse Rate:  [63-108] 63 (04/05 0330) Resp:  [16-18] 18 (04/05 0330) BP: (125-132)/(53-71) 125/65 mmHg (04/05 0330) SpO2:  [96 %-98 %] 97 % (04/05 0330)   #15 lb wt loss since delivery.   Physical exam:  A&O x 3, no acute distress. Pleasant HEENT neg Lungs CTA bilat CV RRR, S1S2 normal Abdo soft, non tender, non acute Extr  Edema reduced a lot. No tenderness. DTR +1/+1 Pelvic deferred, bleeding normal lochia   CBC Latest Ref Rng 12/14/2015 12/13/2015 12/13/2015  WBC 4.0 - 10.5 K/uL 13.4(H) 14.9(H) 14.6(H)  Hemoglobin 12.0 - 15.0 g/dL 4.0(J9.7(L) 8.1(X9.3(L) 9.1(Y9.1(L)  Hematocrit 36.0 - 46.0 % 29.4(L) 27.1(L) 26.4(L)  Platelets 150 - 400 K/uL 144(L) 149(L) 126(L)   CMP Latest Ref Rng 12/14/2015 12/13/2015 12/13/2015  Glucose 65 - 99 mg/dL 75 782(N106(H) 562(Z108(H)  BUN 6 - 20 mg/dL 10 8 7   Creatinine 0.44 - 1.00 mg/dL 3.080.76 6.570.75 8.460.79  Sodium 135 - 145 mmol/L 138 138 138  Potassium 3.5 - 5.1 mmol/L 4.3 4.2 4.3  Chloride 101 - 111 mmol/L 107 108 109  CO2 22 - 32 mmol/L 26 24 23   Calcium 8.9 - 10.3 mg/dL 8.3(L) 7.5(L) 7.7(L)  Total Protein 6.5 - 8.1 g/dL 9.6(E5.7(L) 9.5(M5.8(L) 4.8(L)  Total Bilirubin 0.3 - 1.2 mg/dL 0.4 8.4(X0.2(L) 0.4  Alkaline Phos 38 - 126 U/L 139(H) 137(H) 126  AST 15 - 41 U/L 64(H) 87(H) 107(H)  ALT 14 - 54 U/L 127(H) 153(H) 154(H)    Assessment/Plan: Post c/section #  2 1) HELLP Syndrome - s/p 2 packs of platelets intra-op. 1 BTMZ pre-op for FLM.  Labs improving, continue q 24 hr  checks since stable. BPs stable, no antiHTN meds. S/p Magnesium 24 hrs, diuresing well.   2) Anemia - Iron 3) Post-op- PO Oxycodone and Ibuprofen. No tylenol until LFT improve 4) Post-partum -lactation consult Babies stable. With mother.    Mallisa Alameda R 12/14/2015, 8:34 AM

## 2015-12-14 NOTE — Lactation Note (Signed)
This note was copied from a baby's chart. Lactation Consultation Note; Parents have been bottle feeding formula and EBM as available. Mom reports she is pumping after the babies feed and obtaining a few drops of Colostrum. Babies were in the nursery overnight and mom slept. Reports they have had a lot of instruction and for now will just bottle feed until babies get bigger. Has Medela pump for home. No questions at present. To call prn.   Patient Name: Lauren Robertson [name] ZOXWR'UToday's Date: 12/14/2015 Reason for consult: Follow-up assessment;Late preterm infant;Multiple gestation   Maternal Data Formula Feeding for Exclusion: No  Feeding Feeding Type: Bottle Fed - Formula  LATCH Score/Interventions                      Lactation Tools Discussed/Used     Consult Status Consult Status: PRN    Pamelia HoitWeeks, Saphyre Cillo D 12/14/2015, 9:32 AM

## 2015-12-14 NOTE — Progress Notes (Addendum)
POSTOPERATIVE DAY # 2 S/P CS - twins / HELLP  S:         Reports feeling tired / denies PIH symptoms / epigastric pain resolved             Tolerating po intake / no nausea / no vomiting / + flatus / no BM             Bleeding is light             Pain controlled with oxycodone and motrin             Up ad lib / ambulatory/ voiding QS  Newborns breast feeding  / Circumcision planned   O:  VS: BP 125/65 mmHg  Pulse 63  Temp(Src) 97.7 F (36.5 C) (Oral)  Resp 18  Ht 5' 2"  (1.575 m)  Wt 89.903 kg (198 lb 3.2 oz)  BMI 36.24 kg/m2  SpO2 97%  Breastfeeding? Unknown               Weight: 88.9kg today / 89.9 kg yesterday   LABS:    Results for MAEOLA, MCHANEY (MRN [id number]) as of 12/14/2015 08:23  Ref. Range 12/14/2015 05:19  Sodium Latest Ref Range: 135-145 mmol/L 138  Potassium Latest Ref Range: 3.5-5.1 mmol/L 4.3  Chloride Latest Ref Range: 101-111 mmol/L 107  CO2 Latest Ref Range: 22-32 mmol/L 26  BUN Latest Ref Range: 6-20 mg/dL 10  Creatinine Latest Ref Range: 0.44-1.00 mg/dL 0.76  Calcium Latest Ref Range: 8.9-10.3 mg/dL 8.3 (L)  EGFR (Non-African Amer.) Latest Ref Range: >60 mL/min >60  EGFR (African American) Latest Ref Range: >60 mL/min >60  Glucose Latest Ref Range: 65-99 mg/dL 75  Anion gap Latest Ref Range: 5-15  5  Alkaline Phosphatase Latest Ref Range: 38-126 U/L 139 (H)  Albumin Latest Ref Range: 3.5-5.0 g/dL 2.5 (L)  AST Latest Ref Range: 15-41 U/L 64 (H)  ALT Latest Ref Range: 14-54 U/L 127 (H)  Total Protein Latest Ref Range: 6.5-8.1 g/dL 5.7 (L)  Total Bilirubin Latest Ref Range: 0.3-1.2 mg/dL 0.4  WBC Latest Ref Range: 4.0-10.5 K/uL 13.4 (H)  RBC Latest Ref Range: 3.87-5.11 MIL/uL 3.19 (L)  Hemoglobin Latest Ref Range: 12.0-15.0 g/dL 9.7 (L)  HCT Latest Ref Range: 36.0-46.0 % 29.4 (L)  MCV Latest Ref Range: 78.0-100.0 fL 92.2  MCH Latest Ref Range: 26.0-34.0 pg 30.4  MCHC Latest Ref Range: 30.0-36.0 g/dL 33.0  RDW Latest Ref Range: 11.5-15.5 % 14.1   Platelets Latest Ref Range: 150-400 K/uL 144 (L)               Bloodtype: --/--/B POS, B POS (04/03 1049)  Rubella: Immune (10/18 0000)                                             I&O: net negative 533 today but no I&O done since transfer to [facility]             Physical Exam:             Alert and Oriented X3  Abdomen: soft, non-tender, non-distended             Fundus: firm, non-tender, Ueven             Dressing intact              Incision:  approximated with suture /  no erythema / no ecchymosis / dried drainage  Perineum: no edema  Lochia: light  Extremities: 3+ edema, no calf pain or tenderness, neg Homans  A:        POD # 2 S/P CS with twins / HELLP            Stable PLT and improving            Dependent edema  P:        Routine postoperative care              Ambulation today             Add HCTZ 12.5 mg daily - encouraged water intake / low salt in diet / elevated feet with rest     Artelia Laroche CNM, MSN, FACNM 12/14/2015, 8:22 AM  Per Dr Benjie Karvonen - hold HCTZ start until tomorrow

## 2015-12-15 LAB — CBC
HCT: 26.1 % — ABNORMAL LOW (ref 36.0–46.0)
Hemoglobin: 8.8 g/dL — ABNORMAL LOW (ref 12.0–15.0)
MCH: 31.1 pg (ref 26.0–34.0)
MCHC: 33.7 g/dL (ref 30.0–36.0)
MCV: 92.2 fL (ref 78.0–100.0)
Platelets: 158 10*3/uL (ref 150–400)
RBC: 2.83 MIL/uL — ABNORMAL LOW (ref 3.87–5.11)
RDW: 14.1 % (ref 11.5–15.5)
WBC: 11.1 10*3/uL — ABNORMAL HIGH (ref 4.0–10.5)

## 2015-12-15 LAB — COMPREHENSIVE METABOLIC PANEL
ALT: 100 U/L — ABNORMAL HIGH (ref 14–54)
AST: 60 U/L — ABNORMAL HIGH (ref 15–41)
Albumin: 2.4 g/dL — ABNORMAL LOW (ref 3.5–5.0)
Alkaline Phosphatase: 112 U/L (ref 38–126)
Anion gap: 6 (ref 5–15)
BUN: 11 mg/dL (ref 6–20)
CO2: 24 mmol/L (ref 22–32)
Calcium: 8.5 mg/dL — ABNORMAL LOW (ref 8.9–10.3)
Chloride: 109 mmol/L (ref 101–111)
Creatinine, Ser: 0.63 mg/dL (ref 0.44–1.00)
GFR calc Af Amer: >60 mL/min (ref >60)
GFR calc non Af Amer: >60 mL/min (ref >60)
Glucose, Bld: 80 mg/dL (ref 65–99)
Potassium: 4.2 mmol/L (ref 3.5–5.1)
Sodium: 139 mmol/L (ref 135–145)
Total Bilirubin: 0.2 mg/dL — ABNORMAL LOW (ref 0.3–1.2)
Total Protein: 5.4 g/dL — ABNORMAL LOW (ref 6.5–8.1)

## 2015-12-15 MED ORDER — HYDROCHLOROTHIAZIDE 12.5 MG PO CAPS: 12.5000 mg | ORAL_CAPSULE | Freq: Every day | ORAL | Status: DC

## 2015-12-15 MED ORDER — IBUPROFEN 600 MG PO TABS: 600.0000 mg | ORAL_TABLET | Freq: Four times a day (QID) | ORAL | Status: DC

## 2015-12-15 MED ORDER — OXYCODONE HCL 5 MG PO TABS: 5.0000 mg | ORAL_TABLET | ORAL | Status: DC | PRN

## 2015-12-15 NOTE — Progress Notes (Signed)
POSTOPERATIVE DAY # 3 S/P CS - twins / HELLP  S:         Reports feeling "much better today" / denies PIH symptoms              Tolerating po intake / no nausea / no vomiting / + flatus / no BM             Bleeding is light             Pain controlled with oxycodone and motrin             Up ad lib / ambulatory/ voiding QS  Newborns breast feeding  / Circumcision planned   O:  VS: BP 123/68 mmHg  Pulse 67  Temp(Src) 98.4 F (36.9 C) (Oral)  Resp 18  Ht 5\' 2"  (1.575 m)  Wt 85.9 kg (189 lb 6 oz)  BMI 34.63 kg/m2  SpO2 98%  Breastfeeding              Weight: 85.9 kg today / 89.9 kg yesterday   LABS:    . Results for orders placed or performed during the hospital encounter of 12/12/15 (from the past 24 hour(s))  CBC     Status: Abnormal   Collection Time: 12/15/15  6:12 AM  Result Value Ref Range   WBC 11.1 (H) 4.0 - 10.5 K/uL   RBC 2.83 (L) 3.87 - 5.11 MIL/uL   Hemoglobin 8.8 (L) 12.0 - 15.0 g/dL   HCT 96.026.1 (L) 45.436.0 - 09.846.0 %   MCV 92.2 78.0 - 100.0 fL   MCH 31.1 26.0 - 34.0 pg   MCHC 33.7 30.0 - 36.0 g/dL   RDW 11.914.1 14.711.5 - 82.915.5 %   Platelets 158 150 - 400 K/uL  Comprehensive metabolic panel     Status: Abnormal   Collection Time: 12/15/15  6:12 AM  Result Value Ref Range   Sodium 139 135 - 145 mmol/L   Potassium 4.2 3.5 - 5.1 mmol/L   Chloride 109 101 - 111 mmol/L   CO2 24 22 - 32 mmol/L   Glucose, Bld 80 65 - 99 mg/dL   BUN 11 6 - 20 mg/dL   Creatinine, Ser 5.620.63 0.44 - 1.00 mg/dL   Calcium 8.5 (L) 8.9 - 10.3 mg/dL   Total Protein 5.4 (L) 6.5 - 8.1 g/dL   Albumin 2.4 (L) 3.5 - 5.0 g/dL   AST 60 (H) 15 - 41 U/L   ALT 100 (H) 14 - 54 U/L   Alkaline Phosphatase 112 38 - 126 U/L   Total Bilirubin 0.2 (L) 0.3 - 1.2 mg/dL   GFR calc non Af Amer >60 >60 mL/min   GFR calc Af Amer >60 >60 mL/min   Anion gap 6 5 - 15                Bloodtype: B POS (04/03 1049)  Rubella: Immune (10/18 0000)                                             Physical Exam:  Alert and Oriented X3  Abdomen: soft, non-tender, non-distended             Fundus: firm, non-tender, U-2             Dressing intact  Incision:  approximated with suture / no erythema / no ecchymosis / dried drainage  Perineum: no edema  Lochia: light  Extremities: 3+ edema, no calf pain or tenderness, neg Homans  A:        POD # 3 S/P CS with twins / HELLP            Stable PLT and improving            Dependent edema  P:        Routine postoperative care              Ambulation today             Start HCTZ 12.5 mg daily - encouraged water intake / low salt in diet / elevated feet with rest  D/C home today  BP re-check at [facility] in 1 week     Raelyn Mora, M MSN, CNM  12/15/2015, 9:11 AM

## 2015-12-15 NOTE — Lactation Note (Signed)
This note was copied from a baby's chart. Lactation Consultation Note  Patient Name: Lauren Robertson Date: 12/15/2015 Reason for consult: Follow-up assessment;Late preterm infant;Infant < 6lbs;Multiple gestation Per RN, parents declines LC services before d/c home.   Maternal Data    Feeding Feeding Type: Bottle Fed - Formula  LATCH Score/Interventions                      Lactation Tools Discussed/Used     Consult Status Consult Status: Complete Date: 12/15/15 Follow-up type: In-patient    [name], [name] 12/15/2015, 9:29 AM

## 2015-12-15 NOTE — Discharge Summary (Signed)
OB Discharge Summary     Patient Name: Lauren Robertson DOB: [birth year] MRN: [id number]  Date of admission: 12/12/2015 Delivering MD:    Jayci, Ellefson Tonantzin [[id number]]  Dallas Torok, Arnice, Vanepps Robyne [[id number]]  Eudell Julian   Date of discharge: 12/15/2015  Admitting diagnosis: 35WKS, Twins /HELLP Syndrome Intrauterine pregnancy: [redacted]w[redacted]d     Secondary diagnosis:  Principal Problem:   Postpartum care following cesarean delivery (4/3) Active Problems:   HELLP syndrome in third trimester  Additional problems: none     Discharge diagnosis: Preterm Pregnancy Delivered , HELLP syndrome improved                                                                                              Post partum procedures:magnesium sulfate x 24 hours  Augmentation: N/A  Complications: None  Hospital course:  Sceduled C/S   30 y.o. yo G1P0102 at [redacted]w[redacted]d was admitted to the hospital 12/12/2015 for evaluation of abdominal discomfort when noted to have excessive weight gain and slightly elevated BP 142/84 in office. She was sent to [facility], noted to have HELLP syndrome by labs and was advised urgent delivery. Considering twins, remote from delivery and untested pelvis, 2nd breech, decision was made for delivery by cesarean section with the following indication:Multifetal Gestation, Elective Primary and Severe PEC and HELLP Syndrome.     Fujiko, Picazo [[id number]]  2:21 PM   Mirna, Sutcliffe [[id number]]  2:22 PM ,   Aryona, Sill [[id number]]  12/12/2015   Kyrah, Schiro [[id number]]  12/12/2015   Patient delivered a Viable infant.   Kiandria, Clum [[id number]]  12/12/2015   Gerene, Nedd [[id number]]  12/12/2015  Details of operation can be found in separate operative note.  Pateint had an uncomplicated postpartum course.  She is ambulating, tolerating a regular diet, passing flatus, and urinating well. Patient is discharged home in  stable condition on  12/16/2015          Physical exam  Filed Vitals:   12/14/15 0840 12/14/15 0900 12/14/15 1245 12/15/15 0602  BP: 128/74  115/63 123/68  Pulse: 79  77 67  Temp: 98.1 F (36.7 C)   98.4 F (36.9 C)  TempSrc: Oral   Oral  Resp: Height:      Weight:  88.86 kg (195 lb 14.4 oz)  85.9 kg (189 lb 6 oz)  SpO2: 99%  98%    General: alert, cooperative and no distress Lochia: appropriate Uterine Fundus: firm Incision: Healing well with no significant drainage, No significant erythema, Dressing is clean, dry, and intact, skin well-approximated with sutures DVT Evaluation: No evidence of DVT seen on physical exam. Negative Homan's sign. No cords or calf tenderness. No significant calf/ankle edema. Labs: Lab Results  Component Value Date   WBC 11.1* 12/15/2015   HGB 8.8* 12/15/2015   HCT 26.1* 12/15/2015   MCV 92.2 12/15/2015   PLT 158 12/15/2015   CMP Latest Ref Rng 12/15/2015  Glucose 65 - 99 mg/dL 80  BUN 6 - 20 mg/dL 11  Creatinine 3.01 - 6.01 mg/dL  0.63  Sodium 135 - 145 mmol/L 139  Potassium 3.5 - 5.1 mmol/L 4.2  Chloride 101 - 111 mmol/L 109  CO2 22 - 32 mmol/L 24  Calcium 8.9 - 10.3 mg/dL 1.6(X8.5(L)  Total Protein 6.5 - 8.1 g/dL 0.9(U5.4(L)  Total Bilirubin 0.3 - 1.2 mg/dL 0.4(V0.2(L)  Alkaline Phos 38 - 126 U/L 112  AST 15 - 41 U/L 60(H)  ALT 14 - 54 U/L 100(H)    Discharge instruction: per After Visit Summary and "Baby and Me Booklet".  After visit meds:    Medication List    TAKE these medications        hydrochlorothiazide 12.5 MG capsule  Commonly known as:  MICROZIDE  Take 1 capsule (12.5 mg total) by mouth daily.     ibuprofen 600 MG tablet  Commonly known as:  ADVIL,MOTRIN  Take 1 tablet (600 mg total) by mouth every 6 (six) hours.     oxyCODONE 5 MG immediate release tablet  Commonly known as:  Oxy IR/ROXICODONE  Take 1 tablet (5 mg total) by mouth every 4 (four) hours as needed for moderate pain.     prenatal multivitamin Tabs  tablet  Take 1 tablet by mouth at bedtime.        Diet: routine diet  Activity: Advance as tolerated. Pelvic rest for 6 weeks.   Outpatient follow up: 1 week for visit and CBC, CMP.   Postpartum contraception: Undecided  Newborn Data:   Jodelle Grossarsells, BoyA Sanita [[id number]][[id number]]  Live born female on 12/12/2015 Birth Weight: 5 lb 9.4 oz (2535 g) APGAR: 8, 9   Elinor Dodgearsells, GirlB Shrinika [[id number]][[id number]]  Live born female on 12/12/2015 Birth Weight: 4 lb 13.8 oz (2205 g) APGAR: 7, 9  Baby Feeding: Bottle - pumped breast milk Disposition:home with mother   12/15/2015 Raelyn MoraAWSON, ROLITTA, Judie PetitM, CNM

## 2015-12-15 NOTE — Discharge Instructions (Signed)
Breast Pumping Tips °If you are breastfeeding, there may be times when you cannot feed your baby directly. Returning to work or going on a trip are common examples. Pumping allows you to store breast milk and feed it to your baby later.  °You may not get much milk when you first start to pump. Your breasts should start to make more after a few days. If you pump at the times you usually feed your baby, you may be able to keep making enough milk to feed your baby without also using formula. The more often you pump, the more milk you will produce.  °WHEN SHOULD I PUMP?  °· You can begin to pump soon after delivery. However, some experts recommend waiting about 4 weeks before giving your infant a bottle to make sure breastfeeding is going well.  °· If you plan to return to work, begin pumping a few weeks before. This will help you develop techniques that work best for you. It also lets you build up a supply of breast milk.   °· When you are with your infant, feed on demand and pump after each feeding.   °· When you are away from your infant for several hours, pump for about 15 minutes every 2-3 hours. Pump both breasts at the same time if you can.   °· If your infant has a formula feeding, make sure to pump around the same time.     °· If you drink any alcohol, wait 2 hours before pumping.   °HOW DO I PREPARE TO PUMP? °Your let-down reflex is the natural reaction to stimulation that makes your breast milk flow. It is easier to stimulate this reflex when you are relaxed. Find relaxation techniques that work for you. If you have difficulty with your let-down reflex, try these methods:  °· Smell one of your infant's blankets or an item of clothing.   °· Look at a picture or video of your infant.   °· Sit in a quiet, private space.   °· Massage the breast you plan to pump.   °· Place soothing warmth on the breast.   °· Play relaxing music.   °WHAT ARE SOME GENERAL BREAST PUMPING TIPS? °· Wash your hands before you pump. You  do not need to wash your nipples or breasts. °· There are three ways to pump. °· You can use your hand to massage and compress your breast. °· You can use a handheld manual pump. °· You can use an electric pump.   °· Make sure the suction cup (flange) on the breast pump is the right size. Place the flange directly over the nipple. If it is the wrong size or placed the wrong way, it may be painful and cause nipple damage.   °· If pumping is uncomfortable, apply a small amount of purified or modified lanolin to your nipple and areola. °· If you are using an electric pump, adjust the speed and suction power to be more comfortable. °· If pumping is painful or if you are not getting very much milk, you may need a different type of pump. A lactation consultant can help you determine what type of pump to use.   °· Keep a full water bottle near you at all times. Drinking lots of fluid helps you make more milk.  °· You can store your milk to use later. Pumped breast milk can be stored in a sealable, sterile container or plastic bag. Label all stored breast milk with the date you pumped it. °· Milk can stay out at room temperature for up to 8 hours. °·   You can store your milk in the refrigerator for up to 8 days. °· You can store your milk in the freezer for 3 months. Thaw frozen milk using warm water. Do not put it in the microwave. °· Do not smoke. Smoking can lower your milk supply and harm your infant. If you need help quitting, ask your health care provider to recommend a program.   °WHEN SHOULD I CALL MY HEALTH CARE PROVIDER OR A LACTATION CONSULTANT? °· You are having trouble pumping. °· You are concerned that you are not making enough milk. °· You have nipple pain, soreness, or redness. °· You want to use birth control. Birth control pills may lower your milk supply. Talk to your health care provider about your options. °  °This information is not intended to replace advice given to you by your health care provider.  Make sure you discuss any questions you have with your health care provider. °  °Document Released: 02/14/2010 Document Revised: 09/01/2013 Document Reviewed: 06/19/2013 °[employer]vier Interactive Patient Education ©2016 [employer] °Postpartum Depression and Baby Blues °The postpartum period begins right after the birth of a baby. During this time, there is often a great amount of joy and excitement. It is also a time of many changes in the life of the parents. Regardless of how many times a mother gives birth, each child brings new challenges and dynamics to the family. It is not unusual to have feelings of excitement along with confusing shifts in moods, emotions, and thoughts. All mothers are at risk of developing postpartum depression or the "baby blues." These mood changes can occur right after giving birth, or they may occur many months after giving birth. The baby blues or postpartum depression can be mild or severe. Additionally, postpartum depression can go away rather quickly, or it can be a long-term condition.  °CAUSES °Raised hormone levels and the rapid drop in those levels are thought to be a main cause of postpartum depression and the baby blues. A number of hormones change during and after pregnancy. Estrogen and progesterone usually decrease right after the delivery of your baby. The levels of thyroid hormone and various cortisol steroids also rapidly drop. Other factors that play a role in these mood changes include major life events and genetics.  °RISK FACTORS °If you have any of the following risks for the baby blues or postpartum depression, know what symptoms to watch out for during the postpartum period. Risk factors that may increase the likelihood of getting the baby blues or postpartum depression include: °· Having a personal or family history of depression.   °· Having depression while being pregnant.   °· Having premenstrual mood issues or mood issues related to oral  contraceptives. °· Having a lot of life stress.   °· Having marital conflict.   °· Lacking a social support network.   °· Having a baby with special needs.   °· Having health problems, such as diabetes.   °SIGNS AND SYMPTOMS °Symptoms of baby blues include: °· Brief changes in mood, such as going from extreme happiness to sadness. °· Decreased concentration.   °· Difficulty sleeping.   °· Crying spells, tearfulness.   °· Irritability.   °· Anxiety.   °Symptoms of postpartum depression typically begin within the first month after giving birth. These symptoms include: °· Difficulty sleeping or excessive sleepiness.   °· Marked weight loss.   °· Agitation.   °· Feelings of worthlessness.   °· Lack of interest in activity or food.   °Postpartum psychosis is a very serious condition and can be dangerous. Fortunately, it is   rare. Displaying any of the following symptoms is cause for immediate medical attention. Symptoms of postpartum psychosis include:  °· Hallucinations and delusions.   °· Bizarre or disorganized behavior.   °· Confusion or disorientation.   °DIAGNOSIS  °A diagnosis is made by an evaluation of your symptoms. There are no medical or lab tests that lead to a diagnosis, but there are various questionnaires that a health care provider may use to identify those with the baby blues, postpartum depression, or psychosis. Often, a screening tool called the Edinburgh Postnatal Depression Scale is used to diagnose depression in the postpartum period.  °TREATMENT °The baby blues usually goes away on its own in 1-2 weeks. Social support is often all that is needed. You will be encouraged to get adequate sleep and rest. Occasionally, you may be given medicines to help you sleep.  °Postpartum depression requires treatment because it can last several months or longer if it is not treated. Treatment may include individual or group therapy, medicine, or both to address any social, physiological, and psychological factors  that may play a role in the depression. Regular exercise, a healthy diet, rest, and social support may also be strongly recommended.  °Postpartum psychosis is more serious and needs treatment right away. Hospitalization is often needed. °HOME CARE INSTRUCTIONS °· Get as much rest as you can. Nap when the baby sleeps.   °· Exercise regularly. Some women find yoga and walking to be beneficial.   °· Eat a balanced and nourishing diet.   °· Do little things that you enjoy. Have a cup of tea, take a bubble bath, read your favorite magazine, or listen to your favorite music. °· Avoid alcohol.   °· Ask for help with household chores, cooking, grocery shopping, or running errands as needed. Do not try to do everything.   °· Talk to people close to you about how you are feeling. Get support from your partner, family members, friends, or other new moms. °· Try to stay positive in how you think. Think about the things you are grateful for.   °· Do not spend a lot of time alone.   °· Only take over-the-counter or prescription medicine as directed by your health care provider. °· Keep all your postpartum appointments.   °· Let your health care provider know if you have any concerns.   °SEEK MEDICAL CARE IF: °You are having a reaction to or problems with your medicine. °SEEK IMMEDIATE MEDICAL CARE IF: °· You have suicidal feelings.   °· You think you may harm the baby or someone else. °MAKE SURE YOU: °· Understand these instructions. °· Will watch your condition. °· Will get help right away if you are not doing well or get worse. °  °This information is not intended to replace advice given to you by your health care provider. Make sure you discuss any questions you have with your health care provider. °  °Document Released: 05/31/2004 Document Revised: 09/01/2013 Document Reviewed: 06/08/2013 °Elsevier Interactive Patient Education ©2016 [employer] °Postpartum Care After Cesarean Delivery °After you deliver your newborn  (postpartum period), the usual stay in the hospital is 24-72 hours. If there were problems with your labor or delivery, or if you have other medical problems, you might be in the hospital longer.  °While you are in the hospital, you will receive help and instructions on how to care for yourself and your newborn during the postpartum period.  °While you are in the hospital: °· It is normal for you to have pain or discomfort from the incision in your   abdomen. Be sure to tell your nurses when you are having pain, where the pain is located, and what makes the pain worse. °· If you are breastfeeding, you may feel uncomfortable contractions of your uterus for a couple of weeks. This is normal. The contractions help your uterus get back to normal size. °· It is normal to have some bleeding after delivery. °· For the first 1-3 days after delivery, the flow is red and the amount may be similar to a period. °· It is common for the flow to start and stop. °· In the first few days, you may pass some small clots. Let your nurses know if you begin to pass large clots or your flow increases. °· Do not  flush blood clots down the toilet before having the nurse look at them. °· During the next 3-10 days after delivery, your flow should become more watery and pink or brown-tinged in color. °· Ten to fourteen days after delivery, your flow should be a small amount of yellowish-white discharge. °· The amount of your flow will decrease over the first few weeks after delivery. Your flow may stop in 6-8 weeks. Most women have had their flow stop by 12 weeks after delivery. °· You should change your sanitary pads frequently. °· Wash your hands thoroughly with soap and water for at least 20 seconds after changing pads, using the toilet, or before holding or feeding your newborn. °· Your intravenous (IV) tubing will be removed when you are drinking enough fluids. °· The urine drainage tube (urinary catheter) that was inserted before delivery  may be removed within 6-8 hours after delivery or when feeling returns to your legs. You should feel like you need to empty your bladder within the first 6-8 hours after the catheter has been removed. °· In case you become weak, lightheaded, or faint, call your nurse before you get out of bed for the first time and before you take a shower for the first time. °· Within the first few days after delivery, your breasts may begin to feel tender and full. This is called engorgement. Breast tenderness usually goes away within 48-72 hours after engorgement occurs. You may also notice milk leaking from your breasts. If you are not breastfeeding, do not stimulate your breasts. Breast stimulation can make your breasts produce more milk. °· Spending as much time as possible with your newborn is very important. During this time, you and your newborn can feel close and get to know each other. Having your newborn stay in your room (rooming in) will help to strengthen the bond with your newborn. It will give you time to get to know your newborn and become comfortable caring for your newborn. °· Your hormones change after delivery. Sometimes the hormone changes can temporarily cause you to feel sad or tearful. These feelings should not last more than a few days. If these feelings last longer than that, you should talk to your caregiver. °· If desired, talk to your caregiver about methods of family planning or contraception. °· Talk to your caregiver about immunizations. Your caregiver may want you to have the following immunizations before leaving the hospital: °· Tetanus, diphtheria, and pertussis (Tdap) or tetanus and diphtheria (Td) immunization. It is very important that you and your family (including grandparents) or others caring for your newborn are up-to-date with the Tdap or Td immunizations. The Tdap or Td immunization can help protect your newborn from getting ill. °· Rubella immunization. °· Varicella (chickenpox)    immunization. °· Influenza immunization. You should receive this annual immunization if you did not receive the immunization during your pregnancy. °  °This information is not intended to replace advice given to you by your health care provider. Make sure you discuss any questions you have with your health care provider. °  °Document Released: 05/21/2012 Document Reviewed: 05/21/2012 °[employer]vier Interactive Patient Education ©2016 [employer] °Breastfeeding and Mastitis °Mastitis is inflammation of the breast tissue. It can occur in women who are breastfeeding. This can make breastfeeding painful. Mastitis will sometimes go away on its own. Your health care provider will help determine if treatment is needed. °CAUSES °Mastitis is often associated with a blocked milk (lactiferous) duct. This can happen when too much milk builds up in the breast. Causes of excess milk in the breast can include: °· Poor latch-on. If your baby is not latched onto the breast properly, she or he may not empty your breast completely while breastfeeding. °· Allowing too much time to pass between feedings. °· Wearing a bra or other clothing that is too tight. This puts extra pressure on the lactiferous ducts so milk does not flow through them as it should. °Mastitis can also be caused by a bacterial infection. Bacteria may enter the breast tissue through cuts or openings in the skin. In women who are breastfeeding, this may occur because of cracked or irritated skin. Cracks in the skin are often caused when your baby does not latch on properly to the breast. °SIGNS AND SYMPTOMS °· Swelling, redness, tenderness, and pain in an area of the breast. °· Swelling of the glands under the arm on the same side. °· Fever may or may not accompany mastitis. °If an infection is allowed to progress, a collection of pus (abscess) may develop. °DIAGNOSIS  °Your health care provider can usually diagnose mastitis based on your symptoms and a physical exam.  Tests may be done to help confirm the diagnosis. These may include: °· Removal of pus from the breast by applying pressure to the area. This pus can be examined in the lab to determine which bacteria are present. If an abscess has developed, the fluid in the abscess can be removed with a needle. This can also be used to confirm the diagnosis and determine the bacteria present. In most cases, pus will not be present. °· Blood tests to determine if your body is fighting a bacterial infection. °· Mammogram or ultrasound tests to rule out other problems or diseases. °TREATMENT  °Mastitis that occurs with breastfeeding will sometimes go away on its own. Your health care provider may choose to wait 24 hours after first seeing you to decide whether a prescription medicine is needed. If your symptoms are worse after 24 hours, your health care provider will likely prescribe an antibiotic medicine to treat the mastitis. He or she will determine which bacteria are most likely causing the infection and will then select an appropriate antibiotic medicine. This is sometimes changed based on the results of tests performed to identify the bacteria, or if there is no response to the antibiotic medicine selected. Antibiotic medicines are usually given by mouth. You may also be given medicine for pain. °HOME CARE INSTRUCTIONS °· Only take over-the-counter or prescription medicines for pain, fever, or discomfort as directed by your health care provider. °· If your health care provider prescribed an antibiotic medicine, take the medicine as directed. Make sure you finish it even if you start to feel better. °· Do not wear a   tight or underwire bra. Wear a soft, supportive bra. °· Increase your fluid intake, especially if you have a fever. °· Continue to empty the breast. Your health care provider can tell you whether this milk is safe for your infant or needs to be thrown out. You may be told to stop nursing until your health care  provider thinks it is safe for your baby. Use a breast pump if you are advised to stop nursing. °· Keep your nipples clean and dry. °· Empty the first breast completely before going to the other breast. If your baby is not emptying your breasts completely for some reason, use a breast pump to empty your breasts. °· If you go back to work, pump your breasts while at work to stay in time with your nursing schedule. °· Avoid allowing your breasts to become overly filled with milk (engorged). °SEEK MEDICAL CARE IF: °· You have pus-like discharge from the breast. °· Your symptoms do not improve with the treatment prescribed by your health care provider within 2 days. °SEEK IMMEDIATE MEDICAL CARE IF: °· Your pain and swelling are getting worse. °· You have pain that is not controlled with medicine. °· You have a red line extending from the breast toward your armpit. °· You have a fever or persistent symptoms for more than 2-3 days. °· You have a fever and your symptoms suddenly get worse. °MAKE SURE YOU:  °· Understand these instructions. °· Will watch your condition. °· Will get help right away if you are not doing well or get worse. °  °This information is not intended to replace advice given to you by your health care provider. Make sure you discuss any questions you have with your health care provider. °  °Document Released: 12/22/2004 Document Revised: 09/01/2013 Document Reviewed: 04/02/2013 °[employer]vier Interactive Patient Education ©2016 [employer] °Breastfeeding °Deciding to breastfeed is one of the best choices you can make for you and your baby. A change in hormones during pregnancy causes your breast tissue to grow and increases the number and size of your milk ducts. These hormones also allow proteins, sugars, and fats from your blood supply to make breast milk in your milk-producing glands. Hormones prevent breast milk from being released before your baby is born as well as prompt milk flow after birth. Once  breastfeeding has begun, thoughts of your baby, as well as his or her sucking or crying, can stimulate the release of milk from your milk-producing glands.  °BENEFITS OF BREASTFEEDING °For Your Baby °· Your first milk (colostrum) helps your baby's digestive system function better. °· There are antibodies in your milk that help your baby fight off infections. °· Your baby has a lower incidence of asthma, allergies, and sudden infant death syndrome. °· The nutrients in breast milk are better for your baby than infant formulas and are designed uniquely for your baby's needs. °· Breast milk improves your baby's brain development. °· Your baby is less likely to develop other conditions, such as childhood obesity, asthma, or type 2 diabetes mellitus. °For You °· Breastfeeding helps to create a very special bond between you and your baby. °· Breastfeeding is convenient. Breast milk is always available at the correct temperature and costs nothing. °· Breastfeeding helps to burn calories and helps you lose the weight gained during pregnancy. °· Breastfeeding makes your uterus contract to its prepregnancy size faster and slows bleeding (lochia) after you give birth.   °· Breastfeeding helps to lower your risk of developing type   2 diabetes mellitus, osteoporosis, and breast or ovarian cancer later in life. °SIGNS THAT YOUR BABY IS HUNGRY °Early Signs of Hunger °· Increased alertness or activity. °· Stretching. °· Movement of the head from side to side. °· Movement of the head and opening of the mouth when the corner of the mouth or cheek is stroked (rooting). °· Increased sucking sounds, smacking lips, cooing, sighing, or squeaking. °· Hand-to-mouth movements. °· Increased sucking of fingers or hands. °Late Signs of Hunger °· Fussing. °· Intermittent crying. °Extreme Signs of Hunger °Signs of extreme hunger will require calming and consoling before your baby will be able to breastfeed successfully. Do not wait for the  following signs of extreme hunger to occur before you initiate breastfeeding: °· Restlessness. °· A loud, strong cry. °· Screaming. °BREASTFEEDING BASICS °Breastfeeding Initiation °· Find a comfortable place to sit or lie down, with your neck and back well supported. °· Place a pillow or rolled up blanket under your baby to bring him or her to the level of your breast (if you are seated). Nursing pillows are specially designed to help support your arms and your baby while you breastfeed. °· Make sure that your baby's abdomen is facing your abdomen. °· Gently massage your breast. With your fingertips, massage from your chest wall toward your nipple in a circular motion. This encourages milk flow. You may need to continue this action during the feeding if your milk flows slowly. °· Support your breast with 4 fingers underneath and your thumb above your nipple. Make sure your fingers are well away from your nipple and your baby's mouth. °· Stroke your baby's lips gently with your finger or nipple. °· When your baby's mouth is open wide enough, quickly bring your baby to your breast, placing your entire nipple and as much of the colored area around your nipple (areola) as possible into your baby's mouth. °· More areola should be visible above your baby's upper lip than below the lower lip. °· Your baby's tongue should be between his or her lower gum and your breast. °· Ensure that your baby's mouth is correctly positioned around your nipple (latched). Your baby's lips should create a seal on your breast and be turned out (everted). °· It is common for your baby to suck about 2-3 minutes in order to start the flow of breast milk. °Latching °Teaching your baby how to latch on to your breast properly is very important. An improper latch can cause nipple pain and decreased milk supply for you and poor weight gain in your baby. Also, if your baby is not latched onto your nipple properly, he or she may swallow some air during  feeding. This can make your baby fussy. Burping your baby when you switch breasts during the feeding can help to get rid of the air. However, teaching your baby to latch on properly is still the best way to prevent fussiness from swallowing air while breastfeeding. °Signs that your baby has successfully latched on to your nipple: °· Silent tugging or silent sucking, without causing you pain. °· Swallowing heard between every 3-4 sucks. °· Muscle movement above and in front of his or her ears while sucking. °Signs that your baby has not successfully latched on to nipple: °· Sucking sounds or smacking sounds from your baby while breastfeeding. °· Nipple pain. °If you think your baby has not latched on correctly, slip your finger into the corner of your baby's mouth to break the suction and place it   between your baby's gums. Attempt breastfeeding initiation again. °Signs of Successful Breastfeeding °Signs from your baby: °· A gradual decrease in the number of sucks or complete cessation of sucking. °· Falling asleep. °· Relaxation of his or her body. °· Retention of a small amount of milk in his or her mouth. °· Letting go of your breast by himself or herself. °Signs from you: °· Breasts that have increased in firmness, weight, and size 1-3 hours after feeding. °· Breasts that are softer immediately after breastfeeding. °· Increased milk volume, as well as a change in milk consistency and color by the fifth day of breastfeeding. °· Nipples that are not sore, cracked, or bleeding. °Signs That Your Baby is Getting Enough Milk °· Wetting at least 3 diapers in a 24-hour period. The urine should be clear and pale yellow by age 5 days. °· At least 3 stools in a 24-hour period by age 5 days. The stool should be soft and yellow. °· At least 3 stools in a 24-hour period by age 7 days. The stool should be seedy and yellow. °· No loss of weight greater than 10% of birth weight during the first 3 days of age. °· Average weight  gain of 4-7 ounces (113-198 g) per week after age 4 days. °· Consistent daily weight gain by age 5 days, without weight loss after the age of 2 weeks. °After a feeding, your baby may spit up a small amount. This is common. °BREASTFEEDING FREQUENCY AND DURATION °Frequent feeding will help you make more milk and can prevent sore nipples and breast engorgement. Breastfeed when you feel the need to reduce the fullness of your breasts or when your baby shows signs of hunger. This is called "breastfeeding on demand." Avoid introducing a pacifier to your baby while you are working to establish breastfeeding (the first 4-6 weeks after your baby is born). After this time you may choose to use a pacifier. Research has shown that pacifier use during the first year of a baby's life decreases the risk of sudden infant death syndrome (SIDS). °Allow your baby to feed on each breast as long as he or she wants. Breastfeed until your baby is finished feeding. When your baby unlatches or falls asleep while feeding from the first breast, offer the second breast. Because newborns are often sleepy in the first few weeks of life, you may need to awaken your baby to get him or her to feed. °Breastfeeding times will vary from baby to baby. However, the following rules can serve as a guide to help you ensure that your baby is properly fed: °· Newborns (babies 4 weeks of age or younger) may breastfeed every 1-3 hours. °· Newborns should not go longer than 3 hours during the day or 5 hours during the night without breastfeeding. °· You should breastfeed your baby a minimum of 8 times in a 24-hour period until you begin to introduce solid foods to your baby at around 6 months of age. °BREAST MILK PUMPING °Pumping and storing breast milk allows you to ensure that your baby is exclusively fed your breast milk, even at times when you are unable to breastfeed. This is especially important if you are going back to work while you are still  breastfeeding or when you are not able to be present during feedings. Your lactation consultant can give you guidelines on how long it is safe to store breast milk. °A breast pump is a machine that allows you to pump milk   from your breast into a sterile bottle. The pumped breast milk can then be stored in a refrigerator or freezer. Some breast pumps are operated by hand, while others use electricity. Ask your lactation consultant which type will work best for you. Breast pumps can be purchased, but some hospitals and breastfeeding support groups lease breast pumps on a monthly basis. A lactation consultant can teach you how to hand express breast milk, if you prefer not to use a pump. °CARING FOR YOUR BREASTS WHILE YOU BREASTFEED °Nipples can become dry, cracked, and sore while breastfeeding. The following recommendations can help keep your breasts moisturized and healthy: °· Avoid using soap on your nipples. °· Wear a supportive bra. Although not required, special nursing bras and tank tops are designed to allow access to your breasts for breastfeeding without taking off your entire bra or top. Avoid wearing underwire-style bras or extremely tight bras. °· Air dry your nipples for 3-4 minutes after each feeding. °· Use only cotton bra pads to absorb leaked breast milk. Leaking of breast milk between feedings is normal. °· Use lanolin on your nipples after breastfeeding. Lanolin helps to maintain your skin's normal moisture barrier. If you use pure lanolin, you do not need to wash it off before feeding your baby again. Pure lanolin is not toxic to your baby. You may also hand express a few drops of breast milk and gently massage that milk into your nipples and allow the milk to air dry. °In the first few weeks after giving birth, some women experience extremely full breasts (engorgement). Engorgement can make your breasts feel heavy, warm, and tender to the touch. Engorgement peaks within 3-5 days after you give  birth. The following recommendations can help ease engorgement: °· Completely empty your breasts while breastfeeding or pumping. You may want to start by applying warm, moist heat (in the shower or with warm water-soaked hand towels) just before feeding or pumping. This increases circulation and helps the milk flow. If your baby does not completely empty your breasts while breastfeeding, pump any extra milk after he or she is finished. °· Wear a snug bra (nursing or regular) or tank top for 1-2 days to signal your body to slightly decrease milk production. °· Apply ice packs to your breasts, unless this is too uncomfortable for you. °· Make sure that your baby is latched on and positioned properly while breastfeeding. °If engorgement persists after 48 hours of following these recommendations, contact your health care provider or a lactation consultant. °OVERALL HEALTH CARE RECOMMENDATIONS WHILE BREASTFEEDING °· Eat healthy foods. Alternate between meals and snacks, eating 3 of each per day. Because what you eat affects your breast milk, some of the foods may make your baby more irritable than usual. Avoid eating these foods if you are sure that they are negatively affecting your baby. °· Drink milk, fruit juice, and water to satisfy your thirst (about 10 glasses a day). °· Rest often, relax, and continue to take your prenatal vitamins to prevent fatigue, stress, and anemia. °· Continue breast self-awareness checks. °· Avoid chewing and smoking tobacco. Chemicals from cigarettes that pass into breast milk and exposure to secondhand smoke may harm your baby. °· Avoid alcohol and drug use, including marijuana. °Some medicines that may be harmful to your baby can pass through breast milk. It is important to ask your health care provider before taking any medicine, including all over-the-counter and prescription medicine as well as vitamin and herbal supplements. °It is possible to become   pregnant while breastfeeding. If  birth control is desired, ask your health care provider about options that will be safe for your baby. °SEEK MEDICAL CARE IF: °· You feel like you want to stop breastfeeding or have become frustrated with breastfeeding. °· You have painful breasts or nipples. °· Your nipples are cracked or bleeding. °· Your breasts are red, tender, or warm. °· You have a swollen area on either breast. °· You have a fever or chills. °· You have nausea or vomiting. °· You have drainage other than breast milk from your nipples. °· Your breasts do not become full before feedings by the fifth day after you give birth. °· You feel sad and depressed. °· Your baby is too sleepy to eat well. °· Your baby is having trouble sleeping.   °· Your baby is wetting less than 3 diapers in a 24-hour period. °· Your baby has less than 3 stools in a 24-hour period. °· Your baby's skin or the white part of his or her eyes becomes yellow.   °· Your baby is not gaining weight by 5 days of age. °SEEK IMMEDIATE MEDICAL CARE IF: °· Your baby is overly tired (lethargic) and does not want to wake up and feed. °· Your baby develops an unexplained fever. °  °This information is not intended to replace advice given to you by your health care provider. Make sure you discuss any questions you have with your health care provider. °  °Document Released: 08/27/2005 Document Revised: 05/18/2015 Document Reviewed: 02/18/2013 °Elsevier Interactive Patient Education ©2016 [employer] ° °

## 2015-12-16 LAB — TYPE AND SCREEN
ABO/RH(D): B POS
Antibody Screen: NEGATIVE
Unit division: 0
Unit division: 0

## 2017-01-10 ENCOUNTER — Encounter (HOSPITAL_COMMUNITY): Payer: Self-pay | Admitting: Obstetrics & Gynecology

## 2018-11-11 ENCOUNTER — Other Ambulatory Visit: Payer: Self-pay | Admitting: Obstetrics & Gynecology

## 2018-11-11 DIAGNOSIS — N6320 Unspecified lump in the left breast, unspecified quadrant: Secondary | ICD-10-CM

## 2018-11-14 ENCOUNTER — Ambulatory Visit
Admission: RE | Admit: 2018-11-14 | Discharge: 2018-11-14 | Disposition: A | Discharge status: Home / Self Care | Payer: BLUE CROSS/BLUE SHIELD | Source: Ambulatory Visit | Attending: Obstetrics & Gynecology | Admitting: Obstetrics & Gynecology

## 2018-11-14 DIAGNOSIS — N6320 Unspecified lump in the left breast, unspecified quadrant: Secondary | ICD-10-CM

## 2019-03-12 LAB — OB RESULTS CONSOLE RPR: RPR: NONREACTIVE

## 2019-03-12 LAB — OB RESULTS CONSOLE HEPATITIS B SURFACE ANTIGEN: Hepatitis B Surface Ag: NEGATIVE

## 2019-03-12 LAB — OB RESULTS CONSOLE ANTIBODY SCREEN: Antibody Screen: NEGATIVE

## 2019-03-12 LAB — OB RESULTS CONSOLE GC/CHLAMYDIA
Chlamydia: NEGATIVE
Gonorrhea: NEGATIVE

## 2019-03-12 LAB — OB RESULTS CONSOLE ABO/RH: RH Type: POSITIVE

## 2019-03-12 LAB — OB RESULTS CONSOLE HIV ANTIBODY (ROUTINE TESTING): HIV: NONREACTIVE

## 2019-03-12 LAB — OB RESULTS CONSOLE RUBELLA ANTIBODY, IGM: Rubella: IMMUNE

## 2019-09-11 NOTE — L&D Delivery Note (Signed)
Delivery Note At  1141 a viable female was delivered over intact perineum via  vbac (Presentation:cephalic ; ROA with compound right arm  ).  APGAR: 9,9 ; weight  not yet done.   Placenta status: delivered spontaneously, intact .  Cord:3vv,  with the following complications:none .  Anesthesia:  epidural Episiotomy:  none Lacerations:  right labial, superiorly and inferiorly, superior was hemostatic, inferior reapproximated with 3-0 vicryl and hemostatic, left labial and hemostatic; 2nd degree, repaired with 3-0 vicryl and hemostatic Suture Repair: 3.0 vicryl Est. Blood Loss (mL):   Mom to postpartum.  Baby to Couplet care / Skin to Skin.  Vick Frees 09/26/2019, 12:56 PM

## 2019-09-14 ENCOUNTER — Other Ambulatory Visit: Payer: Self-pay | Admitting: Obstetrics & Gynecology

## 2019-09-15 LAB — OB RESULTS CONSOLE GBS: GBS: NEGATIVE

## 2019-09-23 ENCOUNTER — Telehealth (HOSPITAL_COMMUNITY): Payer: Self-pay | Admitting: *Deleted

## 2019-09-23 ENCOUNTER — Encounter (HOSPITAL_COMMUNITY): Payer: Self-pay | Admitting: *Deleted

## 2019-09-23 NOTE — Telephone Encounter (Signed)
Preadmission screen  

## 2019-09-23 NOTE — Patient Instructions (Signed)
Guila P Lehane  09/23/2019   Your procedure is scheduled on:  10/07/2019  Arrive at 0745 at Entrance C on [employer] at [facility]  and [employer]. You are invited to use the FREE valet parking or use the Visitor's parking deck.  Pick up the phone at the desk and dial [phone].  Call this number if you have problems the morning of surgery: [phone]  Remember:   Do not eat food:(After Midnight) Desps de medianoche.  Do not drink clear liquids: (After Midnight) Desps de medianoche.  Take these medicines the morning of surgery with A SIP OF WATER:  none   Do not wear jewelry, make-up or nail polish.  Do not wear lotions, powders, or perfumes. Do not wear deodorant.  Do not shave 48 hours prior to surgery.  Do not bring valuables to the hospital.  [facility] is not   responsible for any belongings or valuables brought to the hospital.  Contacts, dentures or bridgework may not be worn into surgery.  Leave suitcase in the car. After surgery it may be brought to your room.  For patients admitted to the hospital, checkout time is 11:00 AM the day of              discharge.      Please read over the following fact sheets that you were given:     Preparing for Surgery

## 2019-09-26 ENCOUNTER — Other Ambulatory Visit: Payer: Self-pay

## 2019-09-26 ENCOUNTER — Encounter (HOSPITAL_COMMUNITY): Payer: Self-pay | Admitting: Obstetrics and Gynecology

## 2019-09-26 ENCOUNTER — Inpatient Hospital Stay (HOSPITAL_COMMUNITY): Payer: BLUE CROSS/BLUE SHIELD | Admitting: Anesthesiology

## 2019-09-26 ENCOUNTER — Inpatient Hospital Stay (HOSPITAL_COMMUNITY)
Admission: AD | Admit: 2019-09-26 | Discharge: 2019-09-28 | DRG: 806 | Disposition: A | Discharge status: Home / Self Care | Payer: BLUE CROSS/BLUE SHIELD | Attending: Obstetrics and Gynecology | Admitting: Obstetrics and Gynecology

## 2019-09-26 DIAGNOSIS — D62 Acute posthemorrhagic anemia: Secondary | ICD-10-CM | POA: Diagnosis not present

## 2019-09-26 DIAGNOSIS — Z20822 Contact with and (suspected) exposure to covid-19: Secondary | ICD-10-CM | POA: Diagnosis present

## 2019-09-26 DIAGNOSIS — Z3A38 38 weeks gestation of pregnancy: Secondary | ICD-10-CM | POA: Diagnosis not present

## 2019-09-26 DIAGNOSIS — O34211 Maternal care for low transverse scar from previous cesarean delivery: Secondary | ICD-10-CM | POA: Diagnosis present

## 2019-09-26 DIAGNOSIS — O9081 Anemia of the puerperium: Secondary | ICD-10-CM | POA: Diagnosis not present

## 2019-09-26 DIAGNOSIS — O26893 Other specified pregnancy related conditions, third trimester: Secondary | ICD-10-CM | POA: Diagnosis present

## 2019-09-26 HISTORY — DX: HELLP syndrome (HELLP), third trimester: O14.23

## 2019-09-26 LAB — CBC
HCT: 40.4 % (ref 36.0–46.0)
Hemoglobin: 13.6 g/dL (ref 12.0–15.0)
MCH: 31.3 pg (ref 26.0–34.0)
MCHC: 33.7 g/dL (ref 30.0–36.0)
MCV: 93.1 fL (ref 80.0–100.0)
Platelets: 197 10*3/uL (ref 150–400)
RBC: 4.34 MIL/uL (ref 3.87–5.11)
RDW: 12.9 % (ref 11.5–15.5)
WBC: 8.5 10*3/uL (ref 4.0–10.5)
nRBC: 0 % (ref 0.0–0.2)

## 2019-09-26 LAB — SARS CORONAVIRUS 2 (TAT 6-24 HRS): SARS Coronavirus 2: NEGATIVE

## 2019-09-26 LAB — TYPE AND SCREEN
ABO/RH(D): B POS
Antibody Screen: NEGATIVE

## 2019-09-26 LAB — RPR: RPR Ser Ql: NONREACTIVE

## 2019-09-26 LAB — ABO/RH: ABO/RH(D): B POS

## 2019-09-26 LAB — POCT FERN TEST: POCT Fern Test: POSITIVE

## 2019-09-26 MED ORDER — OXYCODONE-ACETAMINOPHEN 5-325 MG PO TABS: 1.0000 | ORAL_TABLET | ORAL | Status: DC | PRN

## 2019-09-26 MED ORDER — DIPHENHYDRAMINE HCL 25 MG PO CAPS: 25.0000 mg | ORAL_CAPSULE | Freq: Four times a day (QID) | ORAL | Status: DC | PRN

## 2019-09-26 MED ORDER — ONDANSETRON HCL 4 MG/2ML IJ SOLN
4.0000 mg | Freq: Four times a day (QID) | INTRAMUSCULAR | Status: DC | PRN
  Filled 2019-09-26: qty 2

## 2019-09-26 MED ORDER — OXYCODONE-ACETAMINOPHEN 5-325 MG PO TABS: 2.0000 | ORAL_TABLET | ORAL | Status: DC | PRN

## 2019-09-26 MED ORDER — AMMONIA AROMATIC IN INHA
RESPIRATORY_TRACT | Status: AC
  Filled 2019-09-26: qty 10

## 2019-09-26 MED ORDER — TERBUTALINE SULFATE 1 MG/ML IJ SOLN: 0.2500 mg | Freq: Once | INTRAMUSCULAR | Status: DC | PRN

## 2019-09-26 MED ORDER — DIPHENHYDRAMINE HCL 50 MG/ML IJ SOLN: 12.5000 mg | INTRAMUSCULAR | Status: DC | PRN

## 2019-09-26 MED ORDER — DIBUCAINE (PERIANAL) 1 % EX OINT: 1.0000 "application " | TOPICAL_OINTMENT | CUTANEOUS | Status: DC | PRN

## 2019-09-26 MED ORDER — COCONUT OIL OIL
1.0000 "application " | TOPICAL_OIL | Status: DC | PRN
  Administered 2019-09-27: 1 via TOPICAL

## 2019-09-26 MED ORDER — ACETAMINOPHEN 325 MG PO TABS
650.0000 mg | ORAL_TABLET | ORAL | Status: DC | PRN
  Administered 2019-09-26: 21:00:00 650 mg via ORAL
  Filled 2019-09-26: qty 2

## 2019-09-26 MED ORDER — FENTANYL-BUPIVACAINE-NACL 0.5-0.125-0.9 MG/250ML-% EP SOLN: 12.0000 mL/h | EPIDURAL | Status: DC | PRN

## 2019-09-26 MED ORDER — SENNOSIDES-DOCUSATE SODIUM 8.6-50 MG PO TABS
2.0000 | ORAL_TABLET | ORAL | Status: DC
  Administered 2019-09-27 (×2): 2 via ORAL
  Filled 2019-09-26 (×2): qty 2

## 2019-09-26 MED ORDER — ONDANSETRON HCL 4 MG/2ML IJ SOLN: 4.0000 mg | INTRAMUSCULAR | Status: DC | PRN

## 2019-09-26 MED ORDER — LACTATED RINGERS IV SOLN: 500.0000 mL | INTRAVENOUS | Status: DC | PRN

## 2019-09-26 MED ORDER — ACETAMINOPHEN 325 MG PO TABS: 650.0000 mg | ORAL_TABLET | ORAL | Status: DC | PRN

## 2019-09-26 MED ORDER — WITCH HAZEL-GLYCERIN EX PADS: 1.0000 "application " | MEDICATED_PAD | CUTANEOUS | Status: DC | PRN

## 2019-09-26 MED ORDER — PRENATAL MULTIVITAMIN CH
1.0000 | ORAL_TABLET | Freq: Every day | ORAL | Status: DC
  Administered 2019-09-27 – 2019-09-28 (×2): 1 via ORAL
  Filled 2019-09-26 (×2): qty 1

## 2019-09-26 MED ORDER — OXYTOCIN 40 UNITS IN NORMAL SALINE INFUSION - SIMPLE MED
2.5000 [IU]/h | INTRAVENOUS | Status: DC
  Filled 2019-09-26: qty 1000

## 2019-09-26 MED ORDER — EPHEDRINE 5 MG/ML INJ: 10.0000 mg | INTRAVENOUS | Status: DC | PRN

## 2019-09-26 MED ORDER — PHENYLEPHRINE 40 MCG/ML (10ML) SYRINGE FOR IV PUSH (FOR BLOOD PRESSURE SUPPORT)
80.0000 ug | PREFILLED_SYRINGE | INTRAVENOUS | Status: DC | PRN
  Filled 2019-09-26: qty 10

## 2019-09-26 MED ORDER — LACTATED RINGERS IV SOLN: 500.0000 mL | Freq: Once | INTRAVENOUS | Status: DC

## 2019-09-26 MED ORDER — OXYTOCIN 40 UNITS IN NORMAL SALINE INFUSION - SIMPLE MED
1.0000 m[IU]/min | INTRAVENOUS | Status: DC
  Administered 2019-09-26: 08:00:00 2 m[IU]/min via INTRAVENOUS

## 2019-09-26 MED ORDER — LIDOCAINE HCL (PF) 1 % IJ SOLN
INTRAMUSCULAR | Status: DC | PRN
  Administered 2019-09-26 (×2): 4 mL via EPIDURAL

## 2019-09-26 MED ORDER — SIMETHICONE 80 MG PO CHEW: 80.0000 mg | CHEWABLE_TABLET | ORAL | Status: DC | PRN

## 2019-09-26 MED ORDER — LACTATED RINGERS IV SOLN
500.0000 mL | Freq: Once | INTRAVENOUS | Status: AC
  Administered 2019-09-26: 13:00:00 500 mL via INTRAVENOUS

## 2019-09-26 MED ORDER — FLEET ENEMA 7-19 GM/118ML RE ENEM: 1.0000 | ENEMA | RECTAL | Status: DC | PRN

## 2019-09-26 MED ORDER — IBUPROFEN 600 MG PO TABS
600.0000 mg | ORAL_TABLET | Freq: Four times a day (QID) | ORAL | Status: DC
  Administered 2019-09-26 – 2019-09-28 (×8): 600 mg via ORAL
  Filled 2019-09-26 (×9): qty 1

## 2019-09-26 MED ORDER — FENTANYL-BUPIVACAINE-NACL 0.5-0.125-0.9 MG/250ML-% EP SOLN
EPIDURAL | Status: AC
  Filled 2019-09-26: qty 250

## 2019-09-26 MED ORDER — SODIUM CHLORIDE (PF) 0.9 % IJ SOLN
INTRAMUSCULAR | Status: DC | PRN
  Administered 2019-09-26: 12 mL/h via EPIDURAL

## 2019-09-26 MED ORDER — OXYTOCIN BOLUS FROM INFUSION
500.0000 mL | Freq: Once | INTRAVENOUS | Status: AC
  Administered 2019-09-26: 12:00:00 500 mL via INTRAVENOUS

## 2019-09-26 MED ORDER — LIDOCAINE HCL (PF) 1 % IJ SOLN: 30.0000 mL | INTRAMUSCULAR | Status: DC | PRN

## 2019-09-26 MED ORDER — PHENYLEPHRINE 40 MCG/ML (10ML) SYRINGE FOR IV PUSH (FOR BLOOD PRESSURE SUPPORT): 80.0000 ug | PREFILLED_SYRINGE | INTRAVENOUS | Status: DC | PRN

## 2019-09-26 MED ORDER — ONDANSETRON HCL 4 MG PO TABS: 4.0000 mg | ORAL_TABLET | ORAL | Status: DC | PRN

## 2019-09-26 MED ORDER — BENZOCAINE-MENTHOL 20-0.5 % EX AERO
1.0000 "application " | INHALATION_SPRAY | CUTANEOUS | Status: DC | PRN
  Filled 2019-09-26: qty 56

## 2019-09-26 MED ORDER — LACTATED RINGERS IV SOLN: INTRAVENOUS | Status: DC

## 2019-09-26 MED ORDER — TETANUS-DIPHTH-ACELL PERTUSSIS 5-2.5-18.5 LF-MCG/0.5 IM SUSP: 0.5000 mL | Freq: Once | INTRAMUSCULAR | Status: DC

## 2019-09-26 MED ORDER — SOD CITRATE-CITRIC ACID 500-334 MG/5ML PO SOLN: 30.0000 mL | ORAL | Status: DC | PRN

## 2019-09-26 NOTE — Progress Notes (Signed)
Pt was sat on the side of the bed in preparation to be transitioned to the bathroom.  Pt dangled legs and sat to make sure that she did not feel orthostatic.  Stedy used to transport patient to toilet where patient began to feel lightheaded and nauseated.  Help was called and pt went limp and diaphoretic.  Assisted back to bed. Pt awake and alert upon getting back in the bed.  Hypotensive at 86/44.  LR bolus initiated.

## 2019-09-26 NOTE — Anesthesia Procedure Notes (Signed)
Epidural Patient location during procedure: OB Start time: 09/26/2019 9:51 AM End time: 09/26/2019 9:55 AM  Staffing Anesthesiologist: Kaylyn Layer, MD Performed: anesthesiologist   Preanesthetic Checklist Completed: patient identified, IV checked, risks and benefits discussed, monitors and equipment checked, pre-op evaluation and timeout performed  Epidural Patient position: sitting Prep: DuraPrep and site prepped and draped Patient monitoring: continuous pulse ox, blood pressure and heart rate Approach: midline Location: L3-L4 Injection technique: LOR air  Needle:  Needle type: Tuohy  Needle gauge: 17 G Needle length: 9 cm Needle insertion depth: 9 cm Catheter type: closed end flexible Catheter size: 19 Gauge Catheter at skin depth: 14 cm Test dose: negative and Other (1% lidocaine)  Assessment Events: blood not aspirated, injection not painful, no injection resistance, no paresthesia and negative IV test  Additional Notes Patient identified. Risks, benefits, and alternatives discussed with patient including but not limited to bleeding, infection, nerve damage, paralysis, failed block, incomplete pain control, headache, blood pressure changes, nausea, vomiting, reactions to medication, itching, and postpartum back pain. Confirmed with bedside nurse the patient's most recent platelet count. Confirmed with patient that they are not currently taking any anticoagulation, have any bleeding history, or any family history of bleeding disorders. Patient expressed understanding and wished to proceed. All questions were answered. Sterile technique was used throughout the entire procedure. Please see nursing notes for vital signs.   Crisp LOR after 2 needle redirections. Test dose was given through epidural catheter and negative prior to continuing to dose epidural or start infusion. Warning signs of high block given to the patient including shortness of breath, tingling/numbness in  hands, complete motor block, or any concerning symptoms with instructions to call for help. Patient was given instructions on fall risk and not to get out of bed. All questions and concerns addressed with instructions to call with any issues or inadequate analgesia.  Reason for block:procedure for pain

## 2019-09-26 NOTE — MAU Note (Addendum)
Pt reports water broke at 0330, clear/pink fluid. Reports some irregular contractions. No bleeding. Good fetal movement. Prev c/s-plans TOLAC

## 2019-09-26 NOTE — Progress Notes (Signed)
In & out cath done in bed.  Pt transferred from bed to wheelchair by scooting over.  Pt was sitting in the wheelchair for approximately 7 minutes while this writer was getting a bassinet for the baby.  Upon returning to the room, the pt alerted this nurse that she did not feel well.  Pt's color was grey and her BP was 70s systolic.  Pt passed out and help was called to the room and she was transferred to a stretcher.  IV LR bolus initiated.  Dr. Amado Nash called and updated.  Pt transferred to [facility] on stretcher.

## 2019-09-26 NOTE — Lactation Note (Signed)
This note was copied from a baby's chart. Lactation Consultation Note  Patient Name: Lauren Robertson Today's Date: 09/26/2019 Reason for consult: Initial assessment;Early term 37-38.6wks;1st time breastfeeding  6 hours old ETI female who is being exclusively BF by her mother, she's a P2 but not experienced BF. Mom told LC she "tried" to pump and bottle feed her twins (they're now 35 years old) for about 3 months but it was on and off, mainly formula. She's already familiar with hand expression and able to easily get colostrum when doing so, mom also did teach back to [facility] when doing hand expression, praised her for her efforts.  Noticed that she has flat nipples, but her breast are soft and her tissue is very compressible. However, nipples slightly invert when doing the "pinch" test. Set her up with a hand pump and breast shells, instructions, cleaning and storage were reviewed, as well as milk storage guidelines. She has a Motif DEBP that she brought to the hospital.  RN Lauren had already called [facility] for assistance due to a difficult latch. Mom already trying to feed baby when entering the room, but noticed that baby was swaddled, advised mom to do STS prior assisting with baby's feeding. LC did some suck training prior latching, baby would not suck on a gloved finger at first, it took a couple of minutes for her to start sucking.  LC took baby STS to mother's left breast in football position and she was able to latch after a couple of tries. Baby would "let go" but mom would reposition again, explained the key pointers for a deep latch. A few audible swallows were noted during this first 12 minutes feeding; once baby self released, mom proceeded to burp baby. Baby fed again a few minutes afterwards, she was still nursing when exiting the room after the second feeding at the 7 minutes mark.  Parents were very engaged during [facility] consultation and had lots of questions. Reviewed normal newborn behavior,  cluster feeding, feeding cues, pumping schedule, lactogenesis II, newborn weight loss and BF tools. Mom had a NS noted on chart but she told LC she didn't have one neither used it.  Feeding plan:  1. Encouraged mom to feed baby STS 8-12 times/24 hours or sooner if feeding cues are present 2. She'll pre-pump for 3-5 minutes prior latching 3. She'll start wearing her breast shells once she gets home, daytime only 4. Hand expression and spoon feeding were also encouraged  BF brochure, BF resources and feeding diary were reviewed. Dad present and supportive. Parents reported all questions and concerns were answered, they're both aware of LC OP services and will call PRN.   Maternal Data Formula Feeding for Exclusion: No Has patient been taught Hand Expression?: Yes Does the patient have breastfeeding experience prior to this delivery?: No(but "partially" pumped her twins on and off for 3 months, but it was mainly formula)  Feeding Feeding Type: Breast Fed  LATCH Score Latch: Repeated attempts needed to sustain latch, nipple held in mouth throughout feeding, stimulation needed to elicit sucking reflex.(needed some suck training)  Audible Swallowing: A few with stimulation  Type of Nipple: Flat  Comfort (Breast/Nipple): Soft / non-tender  Hold (Positioning): No assistance needed to correctly position infant at breast.  LATCH Score: 7  Interventions Interventions: Breast feeding basics reviewed;Assisted with latch;Skin to skin;Breast massage;Hand express;Breast compression;Adjust position;Support pillows;Shells;Hand pump  Lactation Tools Discussed/Used Tools: Pump;Shells Nipple shield size: 20 Shell Type: Inverted Breast pump type: Manual WIC Program:  No Pump Review: Setup, frequency, and cleaning;Milk Storage Initiated by:: MPeck Date initiated:: 09/26/19   Consult Status Consult Status: Follow-up Date: 09/27/19 Follow-up type: In-patient    Lauren Robertson 09/26/2019,  6:20 PM

## 2019-09-26 NOTE — H&P (Signed)
Lauren Robertson is a 34 y.o. G2P0102 at [redacted]w[redacted]d gestation presents for complaint of Loss of fluid - clear fluid around 0330.  Irregular contractions, no vb, +fm.  Antepartum course: uncomplicated; h/o HELLP with prior pregnancy (twin gestation); h/o c/s (LTCS); EFW 4'7" (49%) at [facility] [place] since 10 wks.  See complete pre-natal records  History OB History    Gravida  2   Para  1   Term      Preterm  1   AB      Living  2     SAB      TAB      Ectopic      Multiple  1   Live Births  2          Past Medical History:  Diagnosis Date  . HELLP syndrome (HELLP), third trimester   . Medical history non-contributory   . Postpartum care following cesarean delivery (4/3) 12/13/2015   Past Surgical History:  Procedure Laterality Date  . CESAREAN SECTION MULTI-GESTATIONAL N/A 12/12/2015   Procedure: CESAREAN SECTION MULTI-GESTATIONAL;  Surgeon: Azucena Fallen, MD;  Location: [facility];  Service: Obstetrics;  Laterality: N/A;  . CRYOTHERAPY     cervix   Family History: family history includes Breast cancer in her maternal grandmother; Cancer in her paternal grandmother; Diabetes in her paternal grandmother. Social History:  reports that she has never smoked. She has never used smokeless tobacco. She reports that she does not drink alcohol or use drugs.  ROS: See above otherwise negative  Prenatal labs:  ABO, Rh: --/--/B POS, B POS Performed at [facility], [place] [address] [place], [place] [address]  [phone]) Antibody: NEG (01/16 6063) Rubella: Immune (07/02 0000) RPR: Nonreactive (07/02 0000)  HBsAg: Negative (07/02 0000)  HIV:Non-reactive (07/02 0000)  GBS: Negative/-- (01/05 0000)  1 hr Glucola: Normal Genetic screening: Normal Anatomy US: Normal  Physical Exam:   Blood pressure (!) 105/58, pulse 83, temperature 98.2 F (36.8 C), temperature source Oral, resp. rate 16, height 5' 2.5" (1.588 m), weight 88.3 kg, SpO2 99 %, unknown if  currently breastfeeding. A&O x 3 HEENT: Normal Lungs: CTAB CV: RRR Abdominal: Soft, Non-tender, Gravid and Estimated fetal weight: 7lbs lbs  Lower Extremities: Non-edematous, Non-tender  Pelvic Exam:      Dilatation: 10cm     Effacement: 100%     Station: +1     Presentation: Cephalic  Labs:  CBC:  Lab Results  Component Value Date   WBC 8.5 09/26/2019   RBC 4.34 09/26/2019   HGB 13.6 09/26/2019   HCT 40.4 09/26/2019   MCV 93.1 09/26/2019   MCH 31.3 09/26/2019   MCHC 33.7 09/26/2019   RDW 12.9 09/26/2019   PLT 197 09/26/2019   CMP:  Lab Results  Component Value Date   NA 139 12/15/2015   K 4.2 12/15/2015   CL 109 12/15/2015   CO2 24 12/15/2015   GLUCOSE 80 12/15/2015   BUN 11 12/15/2015   CREATININE 0.63 12/15/2015   CALCIUM 8.5 (L) 12/15/2015   PROT 5.4 (L) 12/15/2015   AST 60 (H) 12/15/2015   ALT 100 (H) 12/15/2015   ALBUMIN 2.4 (L) 12/15/2015   ALKPHOS 112 12/15/2015   BILITOT 0.2 (L) 12/15/2015   GFRNONAA >60 12/15/2015   GFRAA >60 12/15/2015   ANIONGAP 6 12/15/2015   Urine: No results found for: COLORURINE, APPEARANCEUR, LABSPEC, PHURINE, GLUCOSEU, HGBUR, BILIRUBINUR, KETONESUR, PROTEINUR, NITRITE, LEUKOCYTESUR   Prenatal Transfer Tool  Maternal Diabetes: No Genetic Screening: Normal Maternal Ultrasounds/Referrals: Normal Fetal Ultrasounds or other Referrals:  None Maternal Substance Abuse:  No Significant Maternal Medications:  None Significant Maternal Lab Results: Group B Strep negative  FHT: 140s, nml variability, +accels, early decels noted TOCO:q 1-2 min  Assessment/Plan:  34 y.o. G2P0102 at [redacted]w[redacted]d gestation   1. SROM - h/o ltcs, wants tolac; understands risk including risk of uterine rupture (<1%), irreg contraction and will plan pitocin augmentation and will monitor closely 2. H/o c/s 3. Fetal status reassuring 4. gbs neg   Vick Frees 09/26/2019, 7:50 AM

## 2019-09-26 NOTE — Anesthesia Preprocedure Evaluation (Signed)
Anesthesia Evaluation  Patient identified by MRN, date of birth, ID band Patient awake    Reviewed: Allergy & Precautions, Patient's Chart, lab work & pertinent test results  History of Anesthesia Complications Negative for: history of anesthetic complications  Airway Mallampati: II  TM Distance: >3 FB Neck ROM: Full    Dental no notable dental hx.    Pulmonary neg pulmonary ROS,    Pulmonary exam normal        Cardiovascular negative cardio ROS Normal cardiovascular exam     Neuro/Psych negative neurological ROS  negative psych ROS   GI/Hepatic negative GI ROS, Neg liver ROS,   Endo/Other  negative endocrine ROS  Renal/GU negative Renal ROS  negative genitourinary   Musculoskeletal negative musculoskeletal ROS (+)   Abdominal   Peds  Hematology negative hematology ROS (+)   Anesthesia Other Findings Day of surgery medications reviewed with patient.  Reproductive/Obstetrics (+) Pregnancy (TOLAC, hx of HELLP in prior pregnancy)                             Anesthesia Physical Anesthesia Plan  ASA: II  Anesthesia Plan: Epidural   Post-op Pain Management:    Induction:   PONV Risk Score and Plan: Treatment may vary due to age or medical condition  Airway Management Planned: Natural Airway  Additional Equipment:   Intra-op Plan:   Post-operative Plan:   Informed Consent: I have reviewed the patients History and Physical, chart, labs and discussed the procedure including the risks, benefits and alternatives for the proposed anesthesia with the patient or authorized representative who has indicated his/her understanding and acceptance.       Plan Discussed with:   Anesthesia Plan Comments:         Anesthesia Quick Evaluation

## 2019-09-27 LAB — CBC
HCT: 30.2 % — ABNORMAL LOW (ref 36.0–46.0)
Hemoglobin: 10.3 g/dL — ABNORMAL LOW (ref 12.0–15.0)
MCH: 31.9 pg (ref 26.0–34.0)
MCHC: 34.1 g/dL (ref 30.0–36.0)
MCV: 93.5 fL (ref 80.0–100.0)
Platelets: 162 10*3/uL (ref 150–400)
RBC: 3.23 MIL/uL — ABNORMAL LOW (ref 3.87–5.11)
RDW: 13.1 % (ref 11.5–15.5)
WBC: 9.8 10*3/uL (ref 4.0–10.5)
nRBC: 0 % (ref 0.0–0.2)

## 2019-09-27 NOTE — Plan of Care (Signed)
  Problem: Education: Goal: Knowledge of condition will improve Outcome: Progressing Goal: Individualized Educational Video(s) Outcome: Progressing Goal: Individualized Newborn Educational Video(s) Outcome: Progressing   Problem: Activity: Goal: Will verbalize the importance of balancing activity with adequate rest periods Outcome: Progressing   Problem: Coping: Goal: Ability to identify and utilize available resources and services will improve Outcome: Progressing   Problem: Life Cycle: Goal: Chance of risk for complications during the postpartum period will decrease Outcome: Progressing   Problem: Role Relationship: Goal: Ability to demonstrate positive interaction with newborn will improve Outcome: Progressing   Problem: Skin Integrity: Goal: Demonstration of wound healing without infection will improve Outcome: Progressing

## 2019-09-27 NOTE — Progress Notes (Signed)
No c/o, tol po, ambulating and no dizziness/lightheadedness Voiding w/o difficulty; breastfeeding  Did have 2 syncopal episoded after delivery yesterday, given IVF and felt better Temp:  [97.9 F (36.6 C)-98.4 F (36.9 C)] 97.9 F (36.6 C) (01/17 0651) Pulse Rate:  [67-121] 75 (01/17 0651) Resp:  [16-18] 16 (01/17 0651) BP: (86-118)/(44-71) 105/70 (01/17 0651) SpO2:  [97 %-98 %] 97 % (01/17 0047)   A&ox3 rrr ctab Abd: soft, nt, nd; fundus firm and below umb LE: no edema, nt bilat  CBC Latest Ref Rng & Units 09/27/2019 09/26/2019 12/15/2015  WBC 4.0 - 10.5 K/uL 9.8 8.5 11.1(H)  Hemoglobin 12.0 - 15.0 g/dL 10.3(L) 13.6 8.8(L)  Hematocrit 36.0 - 46.0 % 30.2(L) 40.4 26.1(L)  Platelets 150 - 400 K/uL 162 197 158   A/P: ppd 1 s/p vbac 1. Doing well, contin care; increase water intake 2. Mild acute anemia - recommend observation today and d/c tomorrow, contin increased fluids and monitor for sx of anemia 3. Nursing 4. girl

## 2019-09-27 NOTE — Anesthesia Postprocedure Evaluation (Signed)
Anesthesia Post Note  Patient: Lauren Robertson  Procedure(s) Performed: AN AD HOC LABOR EPIDURAL     Anesthesia Type: Epidural    Last Vitals:  Vitals:   09/27/19 0651 09/27/19 1426  BP: 105/70 115/62  Pulse: 75 61  Resp: 16 16  Temp: 36.6 C 36.7 C  SpO2:  98%    Last Pain:  Vitals:   09/27/19 1426  TempSrc: Oral  PainSc:    Pain Goal:                   Rica Records

## 2019-09-27 NOTE — Lactation Note (Signed)
This note was copied from a baby's chart. Lactation Consultation Note  Patient Name: Lauren Robertson [id number] Date: 09/27/2019 Reason for consult: Follow-up assessment;1st time breastfeeding;Early term 37-38.6wks;Nipple pain/trauma  [id number]  LC entered room to find mom in bed with baby swaddled and suckling on pacifier. Mom states she thinks baby is only soothing herself at the breast and not really eating, therefore mom gave baby a pacifier. Mom asks for LC to check latch. Mom c/o sore nipples and painful latch.  Both nipples noted to be flat at rest but erect with minimal stimulation. Colostrum easily expressed with minimal stimulation. Both nipples appear red but intact at this time. Bruising noted on right areola in the 2 o'clock position.  Encouraged mom to remove blankets and place infant STS while feeding. Mom positioned infant at left breast in football hold with excellent technique. Mom easily latched infant but c/o pain with latch. Infant noted to be latched somewhat shallow onto nipple. Encouraged mom to break seal and mom pulling infant off breast; demonstrated hooking the corner of baby's mouth with finger to break the seal. Mom again latched with verbal instruction from [facility] to have bottom lip touch first and "rock" the baby onto the breast. Mom with excellent technique with C hold and supporting breast during feeding, but continues to c/o pain with latch. Asked mom about alternate positions and mom states she has been doing the football hold technique on both breasts every feeding. Encouraged mom to change to cradle/cross-cradle position to change up the shape of the suction on her breast/nipple. Mom easily latched infant to left breast in cradle hold and immediately verbalizes increased comfort with latch, denying any pain with latch.   Reviewed deep latch re: nose/chin touching breast during feeding with lips flanged. Infant's bottom lip not flanged quite enough and demonstrated chin  tug to mom. Reviewed body alignment re: ears/shoulders/hips in straight line during feeding. Encouraged STS as much as possible and offer breast instead of pacifier if infant displaying feeding cues.  Mom with coconut oil at bedside. Reminded to apply after feedings and allow to air dry.  Mom states once she goes home she will be doing breast and bottle as baby will be having bottles at child care around 7 months of age once mom returns to work.  Encouraged mom to feed exclusively at the breast for as long as possible and pump after feedings/introduce bottle about 2-3 weeks prior to anticipated separation from baby.   Reviewed IP lactation services and encouraged mom to call with any questions/concerns.   Maternal Data Has patient been taught Hand Expression?: Yes  Feeding Feeding Type: Breast Fed  LATCH Score Latch: Grasps breast easily, tongue down, lips flanged, rhythmical sucking.  Audible Swallowing: Spontaneous and intermittent  Type of Nipple: Everted at rest and after stimulation  Comfort (Breast/Nipple): Filling, red/small blisters or bruises, mild/mod discomfort  Hold (Positioning): No assistance needed to correctly position infant at breast.  LATCH Score: 9  Interventions Interventions: Assisted with latch;Skin to skin;Adjust position;Coconut oil;Hand express;Position options  Lactation Tools Discussed/Used     Consult Status Consult Status: Follow-up Date: 09/28/19 Follow-up type: In-patient    Virgia Land 09/27/2019, 11:29 AM

## 2019-09-27 NOTE — Lactation Note (Signed)
This note was copied from a baby's chart. Lactation Consultation Note  Patient Name: Lauren Robertson [id number]'[id number] Date: 09/27/2019 Reason for consult: Follow-up assessment;Mother's request;Difficult latch;1st time breastfeeding  [id number] [phone] [id number] - I followed up with Ms. Potteiger upon request. She states that daughter, Lauren Robertson (34 hours) has been off and on the breast for the last 75 minutes. She wanted to know if baby is getting enough to eat. She states that baby was feeding well throughout the day and then around 1830, she has been off and on the breast with just a few very short (20 minute) breaks.  I noted that her nipples were blistered with compression stripes. When I hand expressed, blood expressed with colostrum. She appeared to have copious colostrum. Her support person states that she has done a little manual pumping and has been able to provide several spoonfuls of colostrum to baby this way.  I assisted with latching baby first in cross cradle hold and then in football hold. I showed Ms. Westrich how to hold the breast in a "U" hold and how to sandwich the nipple to allow for more depth. She has small nipples with pliable tissue. Baby latches readily with strong suckling sequences that occasionally look like chewing motions.   After a few minutes, Kinsley released the breast and fell asleep at the breast. Ms. Perri states that this has been the pattern this evening.   I reviewed how to use a manual pump, and I encouraged Ms. Paugh to post pump and provide any EBM back to baby. I provided an additional spoon, and also provided her with comfort gels. She already had coconut oil.  Baby began to root again. I allowed her to suckle on a gloved finger; strong negative pressure felt. I helped her then latch in football hold on the left breast, adding pillow support.   I recommended feeding on demand throughout the night and post pumping and feeding EBM back to baby. Wear comfort gels, as able  and call for assistance. All questions answered.  Maternal Data Has patient been taught Hand Expression?: Yes Does the patient have breastfeeding experience prior to this delivery?: No  Feeding Feeding Type: Breast Fed  LATCH Score Latch: Grasps breast easily, tongue down, lips flanged, rhythmical sucking.  Audible Swallowing: A few with stimulation  Type of Nipple: Everted at rest and after stimulation  Comfort (Breast/Nipple): Filling, red/small blisters or bruises, mild/mod discomfort  Hold (Positioning): Assistance needed to correctly position infant at breast and maintain latch.  LATCH Score: 7  Interventions Interventions: Breast feeding basics reviewed;Assisted with latch;Skin to skin;Hand express;Breast compression;Adjust position;Support pillows;Comfort gels;Hand pump  Lactation Tools Discussed/Used Pump Review: Setup, frequency, and cleaning;Milk Storage   Consult Status Consult Status: Follow-up Date: 09/28/19 Follow-up type: In-patient    Walker Shadow 09/27/2019, 9:47 PM

## 2019-09-28 MED ORDER — IBUPROFEN 600 MG PO TABS: 600.0000 mg | ORAL_TABLET | Freq: Four times a day (QID) | ORAL | 0 refills | Status: DC

## 2019-09-28 MED ORDER — DOCUSATE SODIUM 100 MG PO CAPS: 100.0000 mg | ORAL_CAPSULE | Freq: Every day | ORAL | 2 refills | Status: AC | PRN

## 2019-09-28 MED ORDER — FERROUS SULFATE 325 (65 FE) MG PO TABS: 325.0000 mg | ORAL_TABLET | Freq: Every day | ORAL | 3 refills | Status: DC

## 2019-09-28 NOTE — Lactation Note (Signed)
This note was copied from a baby's chart. Lactation Consultation Note  Patient Name: Lauren Robertson [id number] Date: 09/28/2019 Reason for consult: Follow-up assessment  P3 mother whose infant is now 70 hours old.  This is an ETI at 38+2 weeks.  Mother did not breast feed her twins (now 35 years old).  Father was spoon feeding EBM when I arrived.  Baby was showing cues after supplementation of EBM.  Offered to observe/assist and mother accepted.  Mother's breasts are soft and non tender and nipples are irritated and bruised.  Mother's areolas are also tender and she is bruised on her right areola.    Observed mother latching baby to the breast easily.  Suggested mother obtain a deeper latch and to keep her fingers back away from the areola when placing baby at the breast.  Demonstrated breast compressions and placed baby deeper into breast tissue with more comfort for mother.  Mother has been using EBM/coconut oil/comfort gels for nipple/areola pain.  Suggested if she becomes too sensitive to give her breasts a rest and pump using her DEBP until it feels better with latching.  Mother did inform me that it feels better after baby begins sucking better.    Engorgement prevention/treatment reviewed.  Mother has a manual pump and a DEBP for home use.  She has our OP phone number for questions after discharge.  Father supportive.   Maternal Data    Feeding Feeding Type: Breast Fed  LATCH Score                   Interventions    Lactation Tools Discussed/Used     Consult Status Consult Status: Complete Date: 09/28/19 Follow-up type: Call as needed    Breana Litts R Chamille Werntz 09/28/2019, 11:29 AM

## 2019-09-28 NOTE — Discharge Summary (Signed)
Obstetric Discharge Summary   Patient Name: Lauren Robertson DOB: 1985/10/22 MRN: [id number]  Date of Admission: 09/26/2019 Date of Discharge: 09/28/2019 Date of Delivery: 09/26/2019 Gestational Age at Delivery: [redacted]w[redacted]d  Primary OB: [facility] - Dr. Benjie Karvonen  Antepartum complications:  - h/o HELLP with prior pregnancy (twin gestation); h/o c/s (LTCS); EFW 4'7" (49%) at 66 wga Prenatal Labs:  ABO, Rh: --/--/B POS, B POS Performed at [facility], [place] [address] [place], [place] [address]  [phone]) Antibody: NEG (01/16 9509) Rubella: Immune (07/02 0000) RPR: Nonreactive (07/02 0000)  HBsAg: Negative (07/02 0000)  HIV:Non-reactive (07/02 0000)  GBS: Negative/-- (01/05 0000)  1 hr Glucola: Normal Genetic screening: Normal Anatomy US: Normal Admitting Diagnosis: 38+2 weeks SROM, planning TOLAC  Secondary Diagnoses: Patient Active Problem List   Diagnosis Date Noted  . Postpartum care following VBAC (1/16) 09/28/2019  . Labor and delivery indication for care or intervention 09/26/2019  . HELLP syndrome in third trimester 12/12/2015    Augmentation: Pitocin  Complications: None  Date of Delivery: 09/26/2019 Delivered By: Dr. Murrell Redden  Delivery Type: vaginal birth after cesarean (VBAC) Anesthesia: epidural Placenta: sponatneous Laceration: 2nd degree perineal, bilateral labial lacerations  Episiotomy: none  Newborn Data: Live born female  Birth Weight: 7 lb 3.9 oz (3285 g) APGAR: 9, 9  Newborn Delivery   Birth date/time: 09/26/2019 11:41:00 Delivery type: Vaginal, Spontaneous         Hospital/Postpartum Course  (Vaginal Delivery): Pt. Admitted at 38+2 weeks in early labor with SROM.  She progressed with Pitocin augmentation and delivered by spontaneous vaginal delivery, VBAC. Patient had an uncomplicated postpartum course.  By time of discharge on PPD#2, her pain was controlled on oral pain medications; she had appropriate lochia and was ambulating,  voiding without difficulty and tolerating regular diet.  She was deemed stable for discharge to home.      Labs: CBC Latest Ref Rng & Units 09/27/2019 09/26/2019 12/15/2015  WBC 4.0 - 10.5 K/uL 9.8 8.5 11.1(H)  Hemoglobin 12.0 - 15.0 g/dL 10.3(L) 13.6 8.8(L)  Hematocrit 36.0 - 46.0 % 30.2(L) 40.4 26.1(L)  Platelets 150 - 400 K/uL 326 712 458   Conflict (See Lab Report): B POS/B POS Performed at [facility], [place] [address], [place], [place] [address]   Physical exam:  BP 103/60 (BP Location: Left Arm)   Pulse 78   Temp 98.2 F (36.8 C) (Oral)   Resp 15   Ht 5' 2.5" (1.588 m)   Wt 88.3 kg   SpO2 100%   Breastfeeding Unknown   BMI 35.03 kg/m  Alert and oriented X3             Lungs: Clear and unlabored             Heart: regular rate and rhythm / no murmurs             Abdomen: soft, non-tender, non-distended              Fundus: firm, non-tender, U-2             Perineum: well approximated 2nd degree repair (+) edema, no erythema or ecchymosis              Lochia: small rubra on pad             Extremities: no edema, no calf pain or tenderness   Disposition: stable, discharge to home Baby Feeding: breast milk Baby Disposition: home with mom  Rh Immune  globulin given: N/A Rubella vaccine given: N/A Tdap vaccine given in AP or PP setting: UTD 2020 Flu vaccine given in AP or PP setting: UTD 2020   Plan:  Lauren Robertson was discharged to home in good condition. Follow-up appointment at [facility] in 6 weeks.  Discharge Instructions: Per After Visit Summary. Refer to After Visit Summary and [facility] discharge booklet  Activity: Advance as tolerated. Pelvic rest for 6 weeks.   Diet: Regular, Heart Healthy Discharge Medications: Allergies as of 09/28/2019   No Known Allergies     Medication List    STOP taking these medications   hydrochlorothiazide 12.5 MG capsule Commonly known as: MICROZIDE   oxyCODONE 5 MG immediate release tablet Commonly  known as: Oxy IR/ROXICODONE   pseudoephedrine-acetaminophen 30-500 MG Tabs tablet Commonly known as: TYLENOL SINUS     TAKE these medications   docusate sodium 100 MG capsule Commonly known as: Colace Take 1 capsule (100 mg total) by mouth daily as needed.   ferrous sulfate 325 (65 FE) MG tablet Take 1 tablet (325 mg total) by mouth daily.   ibuprofen 600 MG tablet Commonly known as: ADVIL Take 1 tablet (600 mg total) by mouth every 6 (six) hours.   prenatal multivitamin Tabs tablet Take 1 tablet by mouth at bedtime.            Discharge Care Instructions  (From admission, onward)         Start     Ordered   09/28/19 0000  Discharge wound care:    Comments: Warm water sitz baths 2-3 times per day as needed   09/28/19 1257         Outpatient follow up:  Follow-up Information    Shea Evans, MD. Schedule an appointment as soon as possible for a visit in 6 week(s).   Specialty: Obstetrics and Gynecology Why: Postpartum visit Contact information: [address] [place] [address] [phone]           Signed:  Carlean Jews, MSN, CNM [facility]

## 2019-09-28 NOTE — Progress Notes (Signed)
PPD #2, SVD/VBAC, 2nd degree and labial repair, baby girl   S:  Reports feeling better, much better recovery  No further syncopal episodes              Tolerating po/ No nausea or vomiting / Denies dizziness or SOB             Bleeding is light             Pain controlled withMotrin and Tylenol             Up ad lib / ambulatory / voiding QS  Newborn breast feeding - going well   O:               VS: BP 103/60 (BP Location: Left Arm)   Pulse 78   Temp 98.2 F (36.8 C) (Oral)   Resp 15   Ht 5' 2.5" (1.588 m)   Wt 88.3 kg   SpO2 100%   Breastfeeding Unknown   BMI 35.03 kg/m  09/28/19 0528  98.2 F (36.8 C)  78  --  15  103/60  Semi-fowlers  100 %  --  --  --  -- TL   09/27/19 2051  98.1 F (36.7 C)  77  --  15  108/65  Semi-fowlers  100 %  --  --  --  -- TL   09/27/19 1426  98 F (36.7 C)  61  --  16  115/62  Sitting  98 %  Room Air  --  --  -- DS   09/27/19 0651  97.9 F (36.6 C)  75  --  16  105/70  Semi-fowlers  --  --  --  --  -- KF   09/27/19 0430  98.4 F (36.9 C)  77  --  16  108/63  Semi-fowlers  --  --  --  --  -- KF   09/27/19 0047  98.1 F (36.7 C)  73  --  16  104/62                   LABS:              Recent Labs    09/26/19 0552 09/27/19 0331  WBC 8.5 9.8  HGB 13.6 10.3*  PLT 197 162               Blood type: --/--/B POS, B POS Performed at [facility], [address], [place], [place] [address]  [phone])  Rubella: Immune (07/02 0000)                     I&O: Intake/Output      01/17 0701 - 01/18 0700 01/18 0701 - 01/19 0700   I.V. (mL/kg) 0 (0)    Other 0    Total Intake(mL/kg) 0 (0)    Urine (mL/kg/hr)     Blood     Total Output     Net 0                       Physical Exam:             Alert and oriented X3  Lungs: Clear and unlabored  Heart: regular rate and rhythm / no murmurs  Abdomen: soft, non-tender, non-distended              Fundus: firm, non-tender, U-2  Perineum: well approximated 2nd degree repair (+) edema, no  erythema or ecchymosis   Lochia: small rubra on pad  Extremities: no edema, no calf pain or tenderness    A: PPD # 2, SVD/VBAC  2nd degree perineal and labial repair  Mild ABL Anemia   Hx. Of HELLP  Doing well - stable status  P: Routine post partum orders  Plan for OTC oral FE daily x 6 weeks  Pt. Requests to take OTC Tylenol and Motrin and declines rx  PEC warning s/s reviewed  D/c home; [facility] discharge book and instructions given  F/u with Dr. Juliene Pina in 6 weeks  Carlean Jews, MSN, CNM [facility]

## 2019-10-05 ENCOUNTER — Other Ambulatory Visit (HOSPITAL_COMMUNITY)
Admission: RE | Admit: 2019-10-05 | Discharge: 2019-10-05 | Disposition: A | Discharge status: Home / Self Care | Payer: BLUE CROSS/BLUE SHIELD | Source: Ambulatory Visit | Attending: Obstetrics & Gynecology | Admitting: Obstetrics & Gynecology

## 2019-10-07 ENCOUNTER — Encounter (HOSPITAL_COMMUNITY): Payer: Self-pay

## 2019-10-07 ENCOUNTER — Inpatient Hospital Stay (HOSPITAL_COMMUNITY): Admit: 2019-10-07 | Payer: BLUE CROSS/BLUE SHIELD | Admitting: Obstetrics & Gynecology

## 2019-10-07 SURGERY — Surgical Case
Anesthesia: Spinal | Laterality: Bilateral

## 2020-02-29 IMAGING — MG DIGITAL DIAGNOSTIC BILATERAL MAMMOGRAM WITH TOMO AND CAD
5 of 10 series · 5 of 30 positions shown · non-contrast
Comparison: None.
COMPARISON: None.
COMPARISON: None.

Addendum:
CLINICAL DATA: Patient describes a palpable lump within the LEFT
breast at the 3 o'clock axis with associated tenderness.

EXAM:
DIGITAL DIAGNOSTIC LEFT MAMMOGRAM WITH CAD AND TOMO
ULTRASOUND LEFT BREAST

[L CC synth-2D]
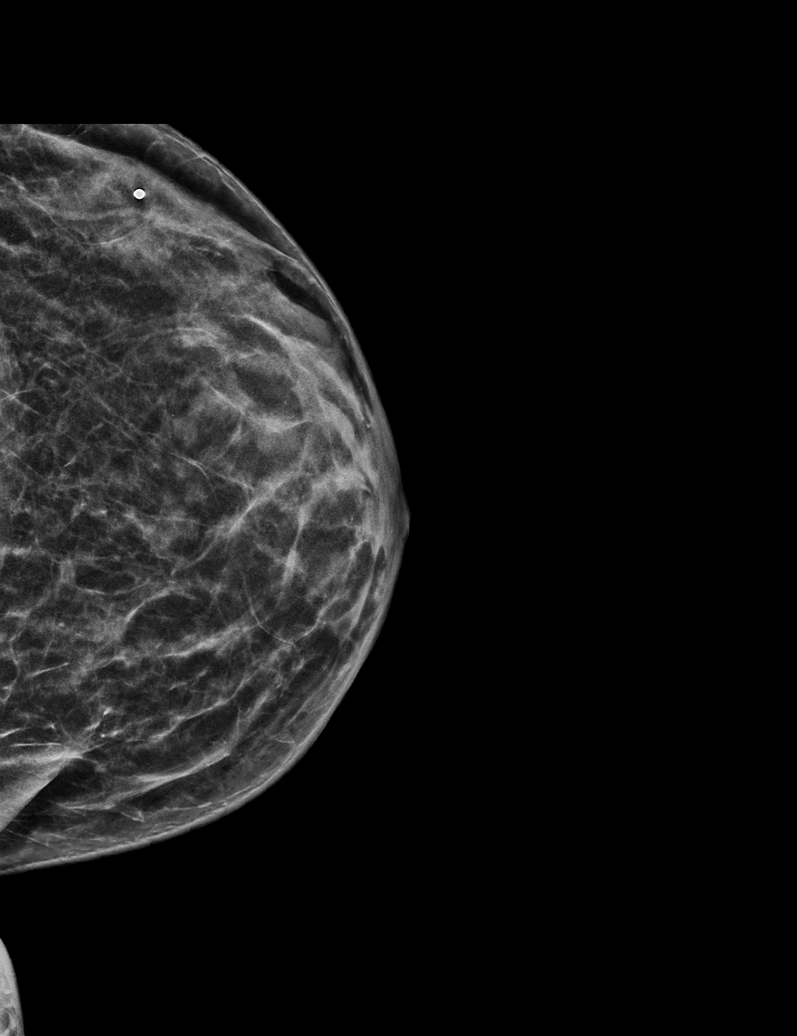

[L TAN synth-2D]
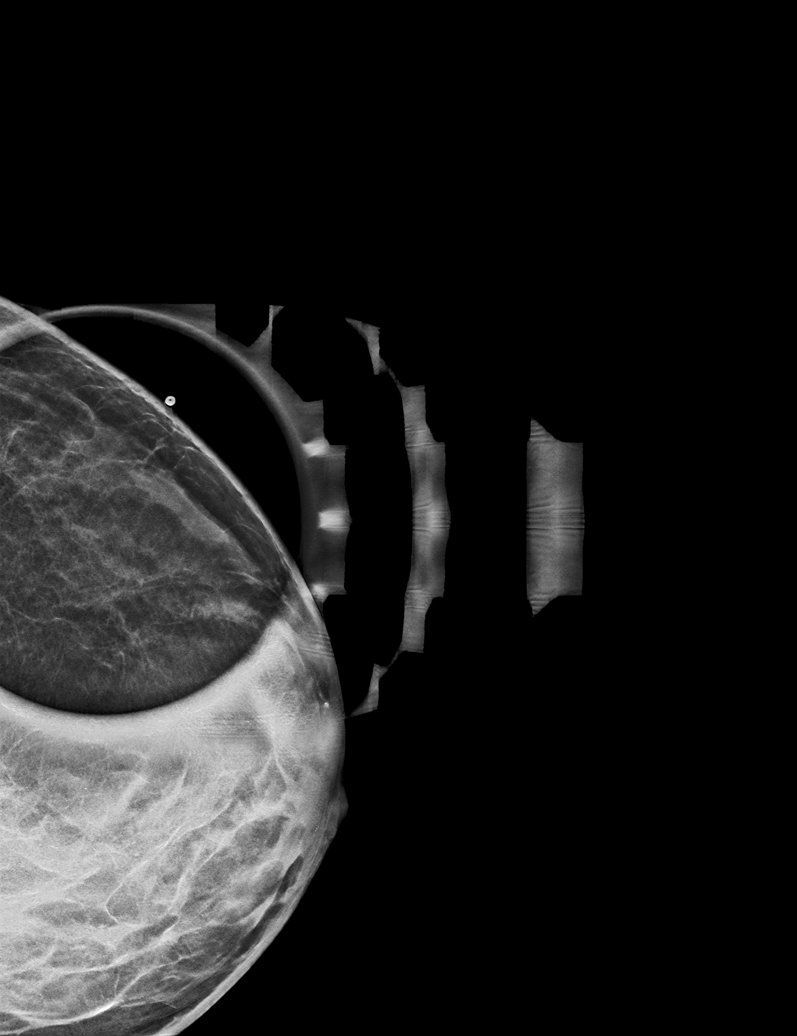

[R CC synth-2D]
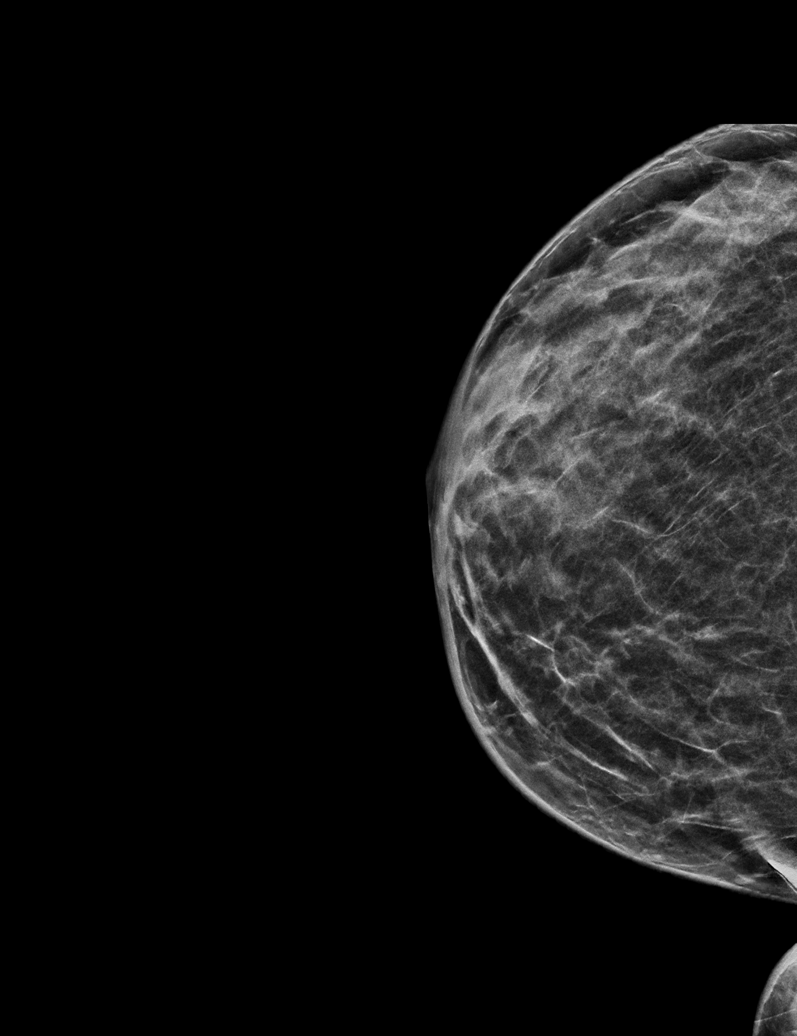

[R MLO synth-2D]
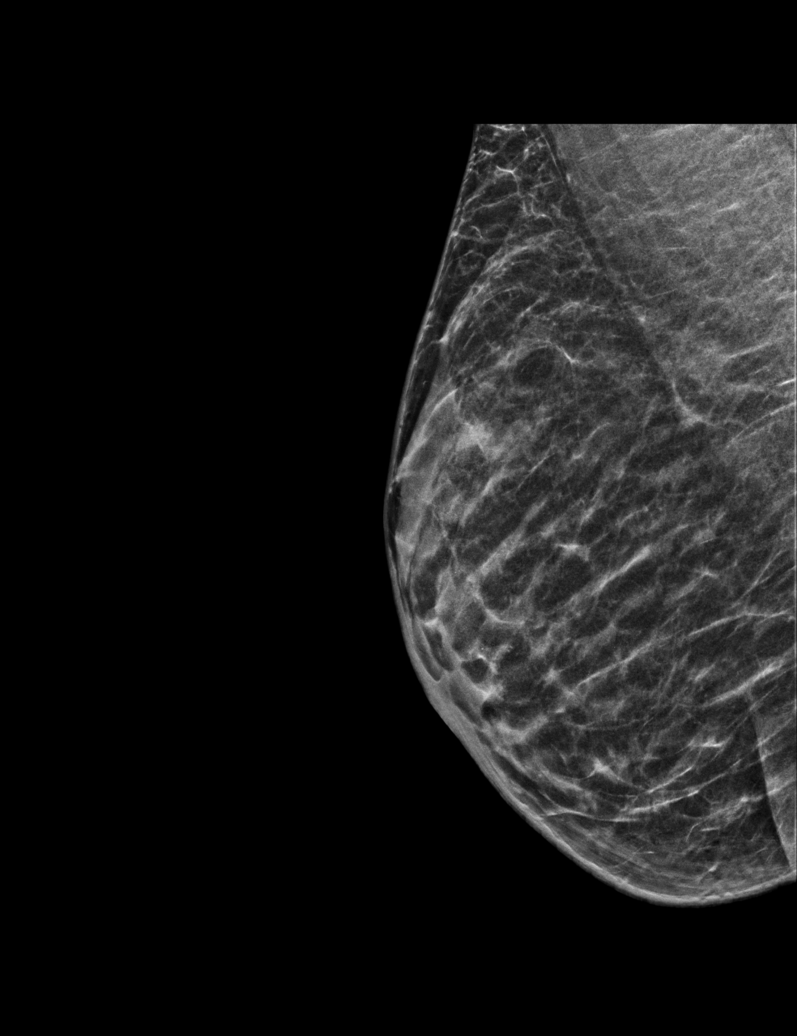

[L MLO synth-2D]
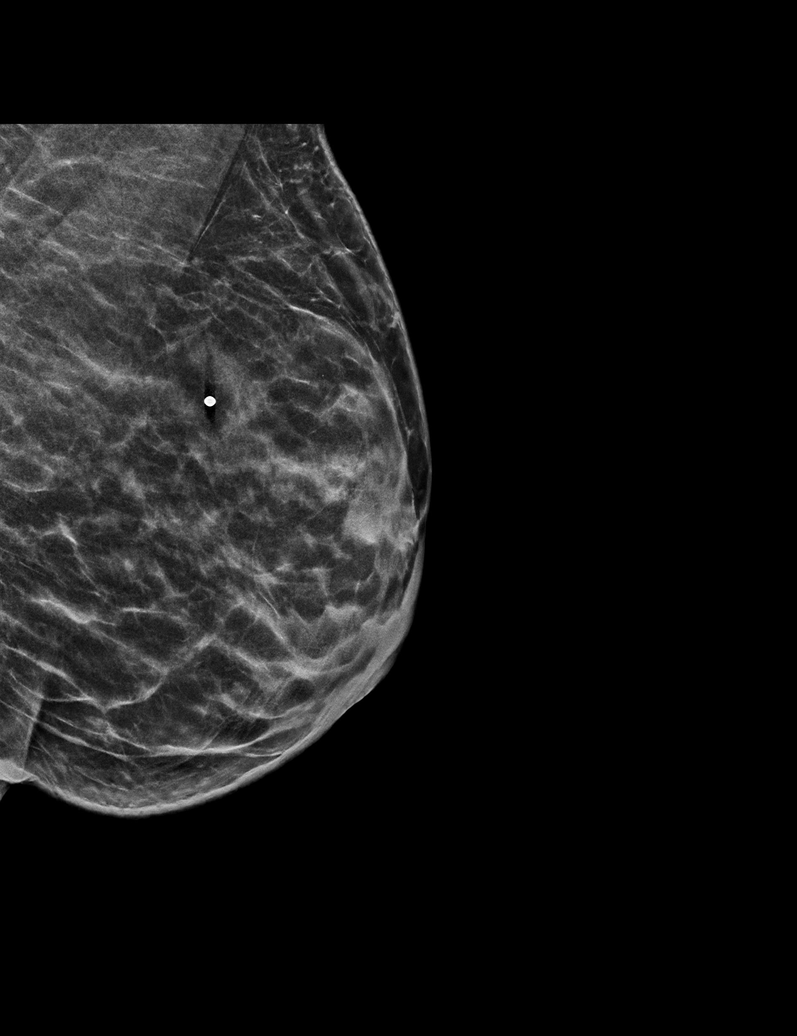

[5 of 30 positions shown; findings below may reference images not displayed]

ACR Breast Density Category c: The breast tissue is heterogeneously
dense, which may obscure small masses.
FINDINGS: There are no dominant masses, suspicious calcifications or secondary
signs of malignancy within either breast. Specifically, there is no
mammographic abnormality within the outer LEFT breast corresponding
to the area of clinical concern.

Mammographic images were processed with CAD.

On physical exam, there is vague soft tissue thickening within the
outer LEFT breast without evidence of circumscribed mass.

Targeted ultrasound is performed, showing only normal fibroglandular
tissues and fat lobules throughout. There is a ridge of normal dense
fibroglandular tissue within the LEFT breast at the 3 o'clock axis,
corresponding to the area of clinical concern.
IMPRESSION: No evidence of malignancy within either breast. Ridge of normal
dense fibroglandular tissue within the outer LEFT breast,
corresponding to the area of clinical concern.

RECOMMENDATION:
1. Screening mammogram at age 39 unless there are persistent or
intervening clinical concerns. (Code:ZD-6-WB1)
2. The patient was instructed to return sooner if the area that she
feels becomes larger and/or firmer to palpation, or if a new
palpable abnormality is identified in either breast.
3. Benign causes of breast pain, and possible remedies, were
discussed with the patient.

I have discussed the findings and recommendations with the patient.
Results were also provided in writing at the conclusion of the
visit. If applicable, a reminder letter will be sent to the patient
regarding the next appointment.

BI-RADS CATEGORY  1: Negative.

ADDENDUM:
Error in report: Exam section should read DIGITAL DIAGNOSTIC
BILATERAL MAMMOGRAM WITH CAD AND TOMO

*** End of Addendum ***
Addendum:
ACR Breast Density Category c: The breast tissue is heterogeneously
dense, which may obscure small masses.
FINDINGS: There are no dominant masses, suspicious calcifications or secondary
signs of malignancy within either breast. Specifically, there is no
mammographic abnormality within the outer LEFT breast corresponding
to the area of clinical concern.

Mammographic images were processed with CAD.

On physical exam, there is vague soft tissue thickening within the
outer LEFT breast without evidence of circumscribed mass.

Targeted ultrasound is performed, showing only normal fibroglandular
tissues and fat lobules throughout. There is a ridge of normal dense
fibroglandular tissue within the LEFT breast at the 3 o'clock axis,
corresponding to the area of clinical concern.
IMPRESSION: No evidence of malignancy within either breast. Ridge of normal
dense fibroglandular tissue within the outer LEFT breast,
corresponding to the area of clinical concern.

RECOMMENDATION:
1. Screening mammogram at age 39 unless there are persistent or
intervening clinical concerns. (Code:ZD-6-WB1)
2. The patient was instructed to return sooner if the area that she
feels becomes larger and/or firmer to palpation, or if a new
palpable abnormality is identified in either breast.
3. Benign causes of breast pain, and possible remedies, were
discussed with the patient.

I have discussed the findings and recommendations with the patient.
Results were also provided in writing at the conclusion of the
visit. If applicable, a reminder letter will be sent to the patient
regarding the next appointment.

BI-RADS CATEGORY  1: Negative.

ADDENDUM:
Error in report: Exam section should read DIGITAL DIAGNOSTIC
BILATERAL MAMMOGRAM WITH CAD AND TOMO

*** End of Addendum ***
ACR Breast Density Category c: The breast tissue is heterogeneously
dense, which may obscure small masses.
FINDINGS: There are no dominant masses, suspicious calcifications or secondary
signs of malignancy within either breast. Specifically, there is no
mammographic abnormality within the outer LEFT breast corresponding
to the area of clinical concern.

Mammographic images were processed with CAD.

On physical exam, there is vague soft tissue thickening within the
outer LEFT breast without evidence of circumscribed mass.

Targeted ultrasound is performed, showing only normal fibroglandular
tissues and fat lobules throughout. There is a ridge of normal dense
fibroglandular tissue within the LEFT breast at the 3 o'clock axis,
corresponding to the area of clinical concern.
IMPRESSION: No evidence of malignancy within either breast. Ridge of normal
dense fibroglandular tissue within the outer LEFT breast,
corresponding to the area of clinical concern.

RECOMMENDATION:
1. Screening mammogram at age 39 unless there are persistent or
intervening clinical concerns. (Code:ZD-6-WB1)
2. The patient was instructed to return sooner if the area that she
feels becomes larger and/or firmer to palpation, or if a new
palpable abnormality is identified in either breast.
3. Benign causes of breast pain, and possible remedies, were
discussed with the patient.

I have discussed the findings and recommendations with the patient.
Results were also provided in writing at the conclusion of the
visit. If applicable, a reminder letter will be sent to the patient
regarding the next appointment.

BI-RADS CATEGORY  1: Negative.

## 2022-07-12 ENCOUNTER — Other Ambulatory Visit: Payer: Self-pay | Admitting: Obstetrics & Gynecology

## 2022-07-12 DIAGNOSIS — N6310 Unspecified lump in the right breast, unspecified quadrant: Secondary | ICD-10-CM

## 2022-07-12 DIAGNOSIS — N6311 Unspecified lump in the right breast, upper outer quadrant: Secondary | ICD-10-CM

## 2022-08-24 ENCOUNTER — Other Ambulatory Visit: Payer: BLUE CROSS/BLUE SHIELD

## 2022-10-23 ENCOUNTER — Encounter: Payer: Self-pay | Admitting: Emergency Medicine

## 2022-10-23 ENCOUNTER — Ambulatory Visit
Admission: EM | Admit: 2022-10-23 | Discharge: 2022-10-23 | Disposition: A | Discharge status: Home / Self Care | Payer: BC Managed Care – PPO | Attending: Nurse Practitioner | Admitting: Nurse Practitioner

## 2022-10-23 DIAGNOSIS — M545 Low back pain, unspecified: Secondary | ICD-10-CM

## 2022-10-23 MED ORDER — PREDNISONE 10 MG (21) PO TBPK: ORAL_TABLET | ORAL | 0 refills | Status: AC

## 2022-10-23 MED ORDER — METHYLPREDNISOLONE SODIUM SUCC 125 MG IJ SOLR
60.0000 mg | Freq: Once | INTRAMUSCULAR | Status: AC
  Administered 2022-10-23: 60 mg via INTRAMUSCULAR

## 2022-10-23 MED ORDER — METHOCARBAMOL 500 MG PO TABS: 500.0000 mg | ORAL_TABLET | Freq: Two times a day (BID) | ORAL | 0 refills | Status: DC | PRN

## 2022-10-23 NOTE — Discharge Instructions (Signed)
We have given you a shot of Solu-Medrol which is a steroid today to help with the back pain.  Start the prednisone taper pack tomorrow morning.  You can also take the Robaxin every 12 hours as needed for pain.    Seek care if symptoms persist or worsen despite treatment.

## 2022-10-23 NOTE — ED Provider Notes (Signed)
[facility]    CSN: [id number] Arrival date & time: 10/23/22  1307      History   Chief Complaint No chief complaint on file.   HPI Lauren Robertson is a 37 y.o. female.   Patient presents today for 1 day history of pain in her lower back.  Reports while at [employer] this morning, she picked up a heavy weight and thinks this exacerbated her chronic low back pain.  Reports she has a bulging disc in her low back and typically injures this area about once per year.  Reports when she does so, she typically goes to her primary care provider who treats her symptoms with Robaxin and oral prednisone which helps with the symptoms.  Reports her primary care provider's practice recently closed and she is currently looking for a new primary care provider.  She denies numbness or tingling in between her legs, numbness or tingling shooting down her legs, new urinary incontinence, dysuria/urinary frequency, and hematuria since the back pain began.  No recent fall to the back.    Past Medical History:  Diagnosis Date   HELLP syndrome (HELLP), third trimester    Medical history non-contributory    Postpartum care following cesarean delivery (4/3) 12/13/2015    Patient Active Problem List   Diagnosis Date Noted   Postpartum care following VBAC (1/16) 09/28/2019   Labor and delivery indication for care or intervention 09/26/2019   HELLP syndrome in third trimester 12/12/2015    Past Surgical History:  Procedure Laterality Date   CESAREAN SECTION MULTI-GESTATIONAL N/A 12/12/2015   Procedure: CESAREAN SECTION MULTI-GESTATIONAL;  Surgeon: Azucena Fallen, MD;  Location: [facility];  Service: Obstetrics;  Laterality: N/A;   CRYOTHERAPY     cervix    OB History     Gravida  2   Para  2   Term  1   Preterm  1   AB      Living  3      SAB      IAB      Ectopic      Multiple  1   Live Births  3            Home Medications    Prior to Admission medications    Medication Sig Start Date End Date Taking? Authorizing Provider  methocarbamol (ROBAXIN) 500 MG tablet Take 1 tablet (500 mg total) by mouth every 12 (twelve) hours as needed for muscle spasms. Do not take with alcohol or while driving or operating heavy machinery.  May cause drowsiness. 10/23/22  Yes Eulogio Bear, NP  predniSONE (STERAPRED UNI-PAK 21 TAB) 10 MG (21) TBPK tablet Take 6 tablets (60 mg total) by mouth daily for 1 day, THEN 5 tablets (50 mg total) daily for 1 day, THEN 4 tablets (40 mg total) daily for 1 day, THEN 3 tablets (30 mg total) daily for 1 day, THEN 2 tablets (20 mg total) daily for 1 day, THEN 1 tablet (10 mg total) daily for 1 day. 10/23/22 10/29/22 Yes Eulogio Bear, NP  ferrous sulfate 325 (65 FE) MG tablet Take 1 tablet (325 mg total) by mouth daily. 09/28/19 09/27/20  Darliss Cheney, CNM  ibuprofen (ADVIL) 600 MG tablet Take 1 tablet (600 mg total) by mouth every 6 (six) hours. 09/28/19   Darliss Cheney, CNM  Prenatal Vit-Fe Fumarate-FA (PRENATAL MULTIVITAMIN) TABS tablet Take 1 tablet by mouth at bedtime.    [provider]  Family History Family History  Problem Relation Age of Onset   Diabetes Paternal Grandmother    Cancer Paternal Grandmother    Breast cancer Maternal Grandmother     Social History Social History   Tobacco Use   Smoking status: Never   Smokeless tobacco: Never  Substance Use Topics   Alcohol use: No   Drug use: No     Allergies   Patient has no known allergies.   Review of Systems Review of Systems Per HPI  Physical Exam Triage Vital Signs ED Triage Vitals  Enc Vitals Group     BP 10/23/22 1324 120/72     Pulse Rate 10/23/22 1324 78     Resp 10/23/22 1324 18     Temp 10/23/22 1324 97.9 F (36.6 C)     Temp Source 10/23/22 1324 Oral     SpO2 10/23/22 1324 98 %     Weight --      Height --      Head Circumference --      Peak Flow --      Pain Score 10/23/22 1325 4     Pain Loc --       Pain Edu? --      Excl. in [place]? --    No data found.  Updated Vital Signs BP 120/72 (BP Location: Right Arm)   Pulse 78   Temp 97.9 F (36.6 C) (Oral)   Resp 18   LMP 09/24/2022 (Approximate)   SpO2 98%   Visual Acuity Right Eye Distance:   Left Eye Distance:   Bilateral Distance:    Right Eye Near:   Left Eye Near:    Bilateral Near:     Physical Exam Vitals and nursing note reviewed.  Constitutional:      General: She is not in acute distress.    Appearance: Normal appearance. She is not toxic-appearing.  HENT:     Mouth/Throat:     Mouth: Mucous membranes are moist.     Pharynx: Oropharynx is clear.  Pulmonary:     Effort: Pulmonary effort is normal. No respiratory distress.  Musculoskeletal:       Back:     Comments: Inspection: No swelling, obvious deformity, or redness to the lumbar spine  Palpation: Tender to palpation in area is marked; no obvious deformities palpated ROM: Full ROM to bilateral lower extremities Strength: 5/5 bilateral lower extremities Neurovascular: neurovascularly intact in left and right lower extremity   Skin:    General: Skin is warm and dry.     Capillary Refill: Capillary refill takes less than 2 seconds.     Coloration: Skin is not jaundiced or pale.     Findings: No erythema.  Neurological:     Mental Status: She is alert and oriented to person, place, and time.  Psychiatric:        Behavior: Behavior is cooperative.      UC Treatments / Results  Labs (all labs ordered are listed, but only abnormal results are displayed) Labs Reviewed - No data to display  EKG   Radiology No results found.  Procedures Procedures (including critical care time)  Medications Ordered in UC Medications  methylPREDNISolone sodium succinate (SOLU-MEDROL) 125 mg/2 mL injection 60 mg (60 mg Intramuscular Given 10/23/22 1343)    Initial Impression / Assessment and Plan / UC Course  I have reviewed the triage vital signs and the nursing  notes.  Pertinent labs & imaging results that were available during my care  of the patient were reviewed by me and considered in my medical decision making (see chart for details).   Patient is well-appearing, normotensive, afebrile, not tachycardic, not tachypneic, oxygenating well on room air.    1. Acute midline low back pain without sciatica Suspect lumbar strain Treat with Robaxin, Solu-Medrol IM today in urgent care Starting tomorrow, prednisone taper No red flags in history or on exam today Strict ER precautions discussed with patient Recommended close follow-up with new PCP and/or sports medicine if symptoms persist or worsen despite treatment  The patient was given the opportunity to ask questions.  All questions answered to their satisfaction.  The patient is in agreement to this plan.    Final Clinical Impressions(s) / UC Diagnoses   Final diagnoses:  Acute midline low back pain without sciatica     Discharge Instructions      We have given you a shot of Solu-Medrol which is a steroid today to help with the back pain.  Start the prednisone taper pack tomorrow morning.  You can also take the Robaxin every 12 hours as needed for pain.    Seek care if symptoms persist or worsen despite treatment.    ED Prescriptions     Medication Sig Dispense Auth. Provider   methocarbamol (ROBAXIN) 500 MG tablet Take 1 tablet (500 mg total) by mouth every 12 (twelve) hours as needed for muscle spasms. Do not take with alcohol or while driving or operating heavy machinery.  May cause drowsiness. 20 tablet Noemi Chapel A, NP   predniSONE (STERAPRED UNI-PAK 21 TAB) 10 MG (21) TBPK tablet Take 6 tablets (60 mg total) by mouth daily for 1 day, THEN 5 tablets (50 mg total) daily for 1 day, THEN 4 tablets (40 mg total) daily for 1 day, THEN 3 tablets (30 mg total) daily for 1 day, THEN 2 tablets (20 mg total) daily for 1 day, THEN 1 tablet (10 mg total) daily for 1 day. 21 tablet Eulogio Bear, NP      PDMP not reviewed this encounter.   Eulogio Bear, NP 10/23/22 [phone]

## 2022-10-23 NOTE — ED Triage Notes (Signed)
Picked up a weight this morning and felt back in lower back.  States she has a bulging disc

## 2023-03-26 LAB — HM PAP SMEAR: HM Pap smear: NEGATIVE

## 2023-03-26 LAB — RESULTS CONSOLE HPV: CHL HPV: NEGATIVE

## 2023-05-22 ENCOUNTER — Encounter: Payer: Self-pay | Admitting: Family Medicine

## 2023-05-23 ENCOUNTER — Encounter: Payer: Self-pay | Admitting: Family Medicine

## 2023-05-23 ENCOUNTER — Ambulatory Visit (INDEPENDENT_AMBULATORY_CARE_PROVIDER_SITE_OTHER): Payer: BC Managed Care – PPO | Admitting: Family Medicine

## 2023-05-23 VITALS — BP 108/60 | HR 67 | Temp 98.1°F | Ht 62.5 in | Wt 162.5 lb

## 2023-05-23 DIAGNOSIS — E663 Overweight: Secondary | ICD-10-CM

## 2023-05-23 DIAGNOSIS — Z13228 Encounter for screening for other metabolic disorders: Secondary | ICD-10-CM

## 2023-05-23 DIAGNOSIS — Z1329 Encounter for screening for other suspected endocrine disorder: Secondary | ICD-10-CM

## 2023-05-23 DIAGNOSIS — M7989 Other specified soft tissue disorders: Secondary | ICD-10-CM

## 2023-05-23 DIAGNOSIS — Z7689 Persons encountering health services in other specified circumstances: Secondary | ICD-10-CM

## 2023-05-23 DIAGNOSIS — Z13220 Encounter for screening for lipoid disorders: Secondary | ICD-10-CM | POA: Diagnosis not present

## 2023-05-23 DIAGNOSIS — Z130 Encounter for screening for diseases of the blood and blood-forming organs and certain disorders involving the immune mechanism: Secondary | ICD-10-CM | POA: Diagnosis not present

## 2023-05-23 NOTE — Patient Instructions (Signed)
-  It was a pleasure to meet you today and look forward to taking care of you.  -Ordered screening labs. Office will call with lab results and will be available on MyChart.  -Please drop off any forms that need to be completed by provider and will have those completed. -If mass on left shoulder becomes larger, please follow up. Will order an ultrasound if it becomes larger.  -Follow up in 1 year.

## 2023-05-23 NOTE — Progress Notes (Signed)
New Patient Office Visit  Subjective    Patient ID: Lauren Robertson, female    DOB: 08-28-1986  Age: 37 y.o. MRN: [id number]  CC:  Chief Complaint  Patient presents with   Establish Care    Pt reports she needs labs for insurance Pt reports she has spot on left side of neck for 1 year. No sx with this     HPI Lauren Robertson presents to establish care with new provider.   Patients primary care provider was Lenise Herald, PA with [facility] in [place], [place]. Last seen about a year ago.   Specialist: Raechel Chute in [place]-right shoulder  [facility] with Dr. Juliene Pina   Patient reports she has "spot" on her left side of neck. She reports she noticed a while ago, but it does seem to change in size. Denies any tenderness or itching around the area.   Patient is taking Meloxicam, but this is being managed by [facility].   Outpatient Encounter Medications as of 05/23/2023  Medication Sig   meloxicam (MOBIC) 15 MG tablet Take 15 mg by mouth daily.   [DISCONTINUED] ibuprofen (ADVIL) 600 MG tablet Take 1 tablet (600 mg total) by mouth every 6 (six) hours.   [DISCONTINUED] ferrous sulfate 325 (65 FE) MG tablet Take 1 tablet (325 mg total) by mouth daily.   [DISCONTINUED] methocarbamol (ROBAXIN) 500 MG tablet Take 1 tablet (500 mg total) by mouth every 12 (twelve) hours as needed for muscle spasms. Do not take with alcohol or while driving or operating heavy machinery.  May cause drowsiness.   [DISCONTINUED] Prenatal Vit-Fe Fumarate-FA (PRENATAL MULTIVITAMIN) TABS tablet Take 1 tablet by mouth at bedtime.   No facility-administered encounter medications on file as of 05/23/2023.    Past Medical History:  Diagnosis Date   Frozen shoulder    Right   HELLP syndrome (HELLP), third trimester    Medical history non-contributory    Postpartum care following cesarean delivery (4/3) 12/13/2015    Past Surgical History:  Procedure Laterality Date   CESAREAN SECTION  MULTI-GESTATIONAL N/A 12/12/2015   Procedure: CESAREAN SECTION MULTI-GESTATIONAL;  Surgeon: Shea Evans, MD;  Location: [facility];  Service: Obstetrics;  Laterality: N/A;   CRYOTHERAPY     cervix    Family History  Problem Relation Age of Onset   Bipolar disorder Mother    Breast cancer Maternal Grandmother    Diabetes Paternal Grandmother    Cancer Paternal Grandmother        Skin    Social History   Socioeconomic History   Marital status: Married    Spouse name: Not on file   Number of children: 3   Years of education: Not on file   Highest education level: Master's degree (e.g., MA, MS, MEng, MEd, MSW, MBA)  Occupational History   Not on file  Tobacco Use   Smoking status: Never   Smokeless tobacco: Never  Vaping Use   Vaping status: Never Used  Substance and Sexual Activity   Alcohol use: Yes    Comment: 2 beers a week   Drug use: No   Sexual activity: Yes  Other Topics Concern   Not on file  Social History Narrative   Not on file   Social Determinants of Health   Financial Resource Strain: Low Risk  (05/22/2023)   Overall Financial Resource Strain (CARDIA)    Difficulty of Paying Living Expenses: Not hard at all  Food Insecurity: No Food Insecurity (05/22/2023)   Hunger Vital  Sign    Worried About Programme researcher, broadcasting/film/video in the Last Year: Never true    Ran Out of Food in the Last Year: Never true  Transportation Needs: No Transportation Needs (05/22/2023)   PRAPARE - Administrator, Civil Service (Medical): No    Lack of Transportation (Non-Medical): No  Physical Activity: Sufficiently Active (05/22/2023)   Exercise Vital Sign    Days of Exercise per Week: 6 days    Minutes of Exercise per Session: 60 min  Stress: No Stress Concern Present (05/22/2023)   [employer] of Occupational Health - Occupational Stress Questionnaire    Feeling of Stress : Not at all  Social Connections: Moderately Integrated (05/23/2023)   Social Connection and Isolation  Panel [NHANES]    Frequency of Communication with Friends and Family: More than three times a week    Frequency of Social Gatherings with Friends and Family: Once a week    Attends Religious Services: More than 4 times per year    Active Member of [employer] or Organizations: No    Attends Banker Meetings: Never    Marital Status: Married  Catering manager Violence: Not At Risk (05/22/2023)   Humiliation, Afraid, Rape, and Kick questionnaire    Fear of Current or Ex-Partner: No    Emotionally Abused: No    Physically Abused: No    Sexually Abused: No    ROS See HPI above    Objective    BP 108/60   Pulse 67   Temp 98.1 F (36.7 C)   Ht 5' 2.5" (1.588 m)   Wt 162 lb 8 oz (73.7 kg)   SpO2 98%   BMI 29.25 kg/m   Physical Exam Vitals reviewed.  Constitutional:      General: She is not in acute distress.    Appearance: Normal appearance. She is overweight. She is not ill-appearing, toxic-appearing or diaphoretic.  HENT:     Head: Normocephalic and atraumatic.  Eyes:     General:        Right eye: No discharge.        Left eye: No discharge.     Conjunctiva/sclera: Conjunctivae normal.  Cardiovascular:     Rate and Rhythm: Normal rate and regular rhythm.     Heart sounds: Normal heart sounds. No murmur heard.    No friction rub. No gallop.  Pulmonary:     Effort: Pulmonary effort is normal. No respiratory distress.     Breath sounds: Normal breath sounds.  Musculoskeletal:        General: Normal range of motion.     Cervical back: Neck supple.     Right lower leg: No edema.     Left lower leg: No edema.  Skin:    General: Skin is warm and dry.     Findings: Lesion (Palpable mass about the size of quarter or slightly smaller that is moveable and non tender to left posterior shoulder, close to neck.) present.  Neurological:     General: No focal deficit present.     Mental Status: She is alert and oriented to person, place, and time. Mental status is at  baseline.  Psychiatric:        Mood and Affect: Mood normal.        Behavior: Behavior normal. Behavior is cooperative.        Thought Content: Thought content normal.        Judgment: Judgment normal.  Assessment & Plan:  Palpable mass of soft tissue of shoulder  Overweight (BMI 25.0-29.9) -     CBC with Differential/Platelet -     Lipid panel -     TSH -     Comprehensive metabolic panel  Lipid screening -     Lipid panel  Screening for endocrine, metabolic and immunity disorder -     CBC with Differential/Platelet -     TSH -     Comprehensive metabolic panel  Encounter to establish care  1.Review health maintenance:  -Covid vaccine/boosters: Declines boosters -Influenza vaccine:Declines, will get when children get vaccine -Hep C screening: Will request records from [facility] -Pap smear: last completed in June 2024, will request records  2.Ordered screening labs: CBC with Diff, TSH, CMP and Lipid Panel. Patient is fasting.  3.Advised to please drop off any forms that need to be completed by provider and will have those completed. 4. Offered a soft tissue ultrasound of palpable mass to the left posterior shoulder, but would like to continue to monitor. Advised if mass on left shoulder becomes larger, please follow up. Will order an ultrasound if it becomes larger.  Return in about 1 year (around 05/22/2024) for physical.   Zandra Abts, NP

## 2023-05-24 ENCOUNTER — Telehealth: Payer: Self-pay

## 2023-05-24 LAB — CBC WITH DIFFERENTIAL/PLATELET
Basophils Absolute: 0.1 10*3/uL (ref 0.0–0.1)
Basophils Relative: 0.7 % (ref 0.0–3.0)
Eosinophils Absolute: 0.1 10*3/uL (ref 0.0–0.7)
Eosinophils Relative: 1.2 % (ref 0.0–5.0)
HCT: 39.7 % (ref 36.0–46.0)
Hemoglobin: 13 g/dL (ref 12.0–15.0)
Lymphocytes Relative: 25.4 % (ref 12.0–46.0)
Lymphs Abs: 1.9 10*3/uL (ref 0.7–4.0)
MCHC: 32.7 g/dL (ref 30.0–36.0)
MCV: 89.9 fl (ref 78.0–100.0)
Monocytes Absolute: 0.5 10*3/uL (ref 0.1–1.0)
Monocytes Relative: 6.8 % (ref 3.0–12.0)
Neutro Abs: 5 10*3/uL (ref 1.4–7.7)
Neutrophils Relative %: 65.9 % (ref 43.0–77.0)
Platelets: 348 10*3/uL (ref 150.0–400.0)
RBC: 4.42 Mil/uL (ref 3.87–5.11)
RDW: 13.1 % (ref 11.5–15.5)
WBC: 7.6 10*3/uL (ref 4.0–10.5)

## 2023-05-24 LAB — LIPID PANEL
Cholesterol: 128 mg/dL (ref 0–200)
HDL: 56.8 mg/dL (ref >39.00)
LDL Cholesterol: 63 mg/dL (ref 0–99)
NonHDL: 71.13
Total CHOL/HDL Ratio: 2
Triglycerides: 39 mg/dL (ref 0.0–149.0)
VLDL: 7.8 mg/dL (ref 0.0–40.0)

## 2023-05-24 LAB — COMPREHENSIVE METABOLIC PANEL
ALT: 10 U/L (ref 0–35)
AST: 14 U/L (ref 0–37)
Albumin: 4.2 g/dL (ref 3.5–5.2)
Alkaline Phosphatase: 33 U/L — ABNORMAL LOW (ref 39–117)
BUN: 17 mg/dL (ref 6–23)
CO2: 27 meq/L (ref 19–32)
Calcium: 9.4 mg/dL (ref 8.4–10.5)
Chloride: 105 meq/L (ref 96–112)
Creatinine, Ser: 0.66 mg/dL (ref 0.40–1.20)
GFR: 111.66 mL/min (ref >60.00)
Glucose, Bld: 60 mg/dL — ABNORMAL LOW (ref 70–99)
Potassium: 4.3 meq/L (ref 3.5–5.1)
Sodium: 139 meq/L (ref 135–145)
Total Bilirubin: 0.6 mg/dL (ref 0.2–1.2)
Total Protein: 7.3 g/dL (ref 6.0–8.3)

## 2023-05-24 LAB — TSH: TSH: 1.76 u[IU]/mL (ref 0.35–5.50)

## 2023-05-24 NOTE — Telephone Encounter (Signed)
-----   Message from Zandra Abts sent at 05/24/2023  3:34 PM EDT ----- Labs are stable.

## 2023-05-24 NOTE — Telephone Encounter (Signed)
Pt called back and was in formed

## 2023-05-28 ENCOUNTER — Telehealth: Payer: Self-pay | Admitting: Family Medicine

## 2023-05-28 NOTE — Telephone Encounter (Signed)
Filled out forms and faxed

## 2023-05-28 NOTE — Telephone Encounter (Signed)
Pt walked in dropped off [facility] paperwork to be completed mailing original back.

## 2023-05-28 NOTE — Telephone Encounter (Signed)
Pt is aware.  

## 2023-05-29 ENCOUNTER — Other Ambulatory Visit: Payer: Self-pay

## 2023-06-24 ENCOUNTER — Ambulatory Visit (INDEPENDENT_AMBULATORY_CARE_PROVIDER_SITE_OTHER): Payer: BC Managed Care – PPO | Admitting: Family Medicine

## 2023-06-24 VITALS — BP 122/74 | HR 79 | Temp 98.0°F | Wt 165.0 lb

## 2023-06-24 DIAGNOSIS — M436 Torticollis: Secondary | ICD-10-CM | POA: Diagnosis not present

## 2023-06-24 DIAGNOSIS — M7989 Other specified soft tissue disorders: Secondary | ICD-10-CM | POA: Diagnosis not present

## 2023-06-24 MED ORDER — METHOCARBAMOL 500 MG PO TABS: 500.0000 mg | ORAL_TABLET | Freq: Three times a day (TID) | ORAL | 0 refills | Status: AC | PRN

## 2023-06-24 NOTE — Patient Instructions (Addendum)
-  Ordered a soft tissue ultrasound of neck for palpable mass of left posterior shoulder, close to cervical region. Please call the office if you do not hear back about an appointment within the next 3 days. Office will call with results and you may see results on MyChart. -Prescribed Methocarbamol 500mg  tablet, 1 tablet every 8 hours as needed for muscle spasm. Caution: Medication can cause drowsiness.

## 2023-06-24 NOTE — Progress Notes (Signed)
   Established Patient Office Visit   Subjective:  Patient ID: Lauren Robertson, female    DOB: November 03, 1985  Age: 37 y.o. MRN: [id number]  Chief Complaint  Patient presents with   Follow-up    Spot on neck is getting, starting to give her a stiff neck     HPI Patient is complaining of left sided neck stiffness that she is concerned could be related to the palpable mass of the left shoulder that is leading up to her cervical region. Patient was last seen on 05/23/2023, offered a soft tissue ultrasound at that visit and declined. Today, she is complaining of a daily stiff neck for the last 2-3 weeks. She reports the mass does not feel larger.   ROS See HPI above     Objective:   BP 122/74 (BP Location: Right Arm, Patient Position: Sitting, Cuff Size: Normal)   Pulse 79   Temp 98 F (36.7 C) (Oral)   Wt 165 lb (74.8 kg)   SpO2 97%   BMI 29.70 kg/m    Physical Exam Vitals reviewed.  Constitutional:      General: She is not in acute distress.    Appearance: Normal appearance. She is overweight. She is not ill-appearing, toxic-appearing or diaphoretic.  HENT:     Head: Normocephalic and atraumatic.  Eyes:     General:        Right eye: No discharge.        Left eye: No discharge.     Conjunctiva/sclera: Conjunctivae normal.  Cardiovascular:     Rate and Rhythm: Normal rate Pulmonary:     Effort: Pulmonary effort is normal. No respiratory distress.    Musculoskeletal:        General: Normal range of motion.     Cervical back: Neck supple.     Skin:    General: Skin is warm and dry.     Findings: Lesion (Palpable mass about the size of quarter or slightly smaller that is moveable and non tender to left posterior shoulder, close to neck.) present.  Neurological:     General: No focal deficit present.     Mental Status: She is alert and oriented to person, place, and time. Mental status is at baseline.  Psychiatric:        Mood and Affect: Mood normal.        Behavior:  Behavior normal. Behavior is cooperative.        Thought Content: Thought content normal.        Judgment: Judgment normal.      Assessment & Plan:  Palpable mass of soft tissue of shoulder -     US SOFT TISSUE HEAD & NECK (NON-THYROID); Future  Neck stiffness -     Methocarbamol; Take 1 tablet (500 mg total) by mouth every 8 (eight) hours as needed for up to 5 days for muscle spasms.  Dispense: 15 tablet; Refill: 0  -Ordered a soft tissue ultrasound of neck for palpable mass of left posterior shoulder, close to cervical region. Advised to please call the office if she does not hear back about an appointment within the next 3 days. Office will call with results and she may see results on MyChart. -Prescribed Methocarbamol 500mg  tablet, 1 tablet every 8 hours as needed for muscle spasm. Caution advised: Medication can cause drowsiness.   Zandra Abts, NP

## 2023-06-28 ENCOUNTER — Ambulatory Visit (HOSPITAL_BASED_OUTPATIENT_CLINIC_OR_DEPARTMENT_OTHER)
Admission: RE | Admit: 2023-06-28 | Discharge: 2023-06-28 | Disposition: A | Discharge status: Home / Self Care | Payer: BC Managed Care – PPO | Source: Ambulatory Visit | Attending: Family Medicine

## 2023-06-28 DIAGNOSIS — M7989 Other specified soft tissue disorders: Secondary | ICD-10-CM | POA: Insufficient documentation

## 2023-07-01 ENCOUNTER — Other Ambulatory Visit: Payer: Self-pay

## 2023-07-01 DIAGNOSIS — D170 Benign lipomatous neoplasm of skin and subcutaneous tissue of head, face and neck: Secondary | ICD-10-CM

## 2023-07-08 ENCOUNTER — Other Ambulatory Visit (HOSPITAL_COMMUNITY): Payer: Self-pay | Admitting: Obstetrics & Gynecology

## 2023-07-08 DIAGNOSIS — N6311 Unspecified lump in the right breast, upper outer quadrant: Secondary | ICD-10-CM

## 2023-08-30 ENCOUNTER — Encounter: Payer: Self-pay | Admitting: Family Medicine

## 2024-02-04 DIAGNOSIS — M7541 Impingement syndrome of right shoulder: Secondary | ICD-10-CM | POA: Insufficient documentation

## 2024-05-26 ENCOUNTER — Encounter: Payer: BC Managed Care – PPO | Admitting: Family Medicine

## 2024-06-18 ENCOUNTER — Other Ambulatory Visit: Payer: Self-pay | Admitting: Medical Genetics

## 2024-06-24 ENCOUNTER — Encounter: Payer: Self-pay | Admitting: Student in an Organized Health Care Education/Training Program

## 2024-06-24 ENCOUNTER — Ambulatory Visit (INDEPENDENT_AMBULATORY_CARE_PROVIDER_SITE_OTHER): Admitting: Student in an Organized Health Care Education/Training Program

## 2024-06-24 VITALS — BP 105/58 | HR 78 | Ht 62.5 in | Wt 148.0 lb

## 2024-06-24 DIAGNOSIS — D179 Benign lipomatous neoplasm, unspecified: Secondary | ICD-10-CM | POA: Insufficient documentation

## 2024-06-24 DIAGNOSIS — D170 Benign lipomatous neoplasm of skin and subcutaneous tissue of head, face and neck: Secondary | ICD-10-CM

## 2024-06-24 DIAGNOSIS — Z0000 Encounter for general adult medical examination without abnormal findings: Secondary | ICD-10-CM | POA: Insufficient documentation

## 2024-06-24 DIAGNOSIS — Z13220 Encounter for screening for lipoid disorders: Secondary | ICD-10-CM

## 2024-06-24 DIAGNOSIS — Z131 Encounter for screening for diabetes mellitus: Secondary | ICD-10-CM

## 2024-06-24 DIAGNOSIS — M7541 Impingement syndrome of right shoulder: Secondary | ICD-10-CM

## 2024-06-24 LAB — BASIC METABOLIC PANEL WITH GFR
BUN: 15 mg/dL (ref 6–23)
CO2: 28 meq/L (ref 19–32)
Calcium: 9.2 mg/dL (ref 8.4–10.5)
Chloride: 105 meq/L (ref 96–112)
Creatinine, Ser: 0.7 mg/dL (ref 0.40–1.20)
GFR: 109.25 mL/min (ref >60.00)
Glucose, Bld: 90 mg/dL (ref 70–99)
Potassium: 4.5 meq/L (ref 3.5–5.1)
Sodium: 139 meq/L (ref 135–145)

## 2024-06-24 LAB — HEMOGLOBIN A1C: Hgb A1c MFr Bld: 5.4 % (ref 4.6–6.5)

## 2024-06-24 LAB — LIPID PANEL
Cholesterol: 142 mg/dL (ref 0–200)
HDL: 56.4 mg/dL (ref >39.00)
LDL Cholesterol: 78 mg/dL (ref 0–99)
NonHDL: 85.13
Total CHOL/HDL Ratio: 3
Triglycerides: 36 mg/dL (ref 0.0–149.0)
VLDL: 7.2 mg/dL (ref 0.0–40.0)

## 2024-06-24 NOTE — Assessment & Plan Note (Signed)
 Chronic bilateral shoulder osteoarthritis is managed with diclofenac with [facility]. She takes it once daily. No gastrointestinal issues are reported, but symptoms worsen without medication. Continue diclofenac as prescribed by the orthopedic specialist.  Will check renal function today.

## 2024-06-24 NOTE — Assessment & Plan Note (Signed)
 A lipoma on the posterior neck, previously evaluated with ultrasound, is small and generally painless, though she reports neck stiffness and discomfort.  The ultrasound showed a 2.6 cm soft tissue structure most consistent with a lipoma.  Patient would like surgical excision.  I have referred her to general surgery.

## 2024-06-24 NOTE — Patient Instructions (Addendum)
   VISIT SUMMARY: Today, you came in for lab work required by your insurance and to evaluate a lipoma on the back of your neck. We also reviewed your general health and discussed your shoulder arthritis management.  YOUR PLAN: -ADULT WELLNESS VISIT: Your routine wellness visit showed no acute issues. Your blood pressure is 105/58 mmHg, and your BMI is 26. You exercise regularly, have no concerns with alcohol, use an IUD for birth control, and your cervical cancer screening is up to date. We have ordered labs to check your triglycerides, glucose, HDL, total cholesterol, LDL, and A1c levels.  -BILATERAL SHOULDER OSTEOARTHRITIS: Osteoarthritis is a condition where the protective cartilage that cushions the ends of your bones wears down over time. Your chronic shoulder arthritis is managed with diclofenac, which you take once daily. You should continue taking it as prescribed by your orthopedic specialist, which is twice daily, to better manage your symptoms.  -LIPOMA OF POSTERIOR NECK: A lipoma is a benign, fatty lump that grows under your skin. The lipoma on your neck is small and generally painless, but you have reported neck stiffness and discomfort. We are referring you to dermatology for a consultation about possibly removing the lipoma.  INSTRUCTIONS: Please follow up with the dermatology department for a consultation regarding your neck lipoma. Continue taking your diclofenac as prescribed by your orthopedic specialist. We will notify you with the results of your lab work once they are available.

## 2024-06-24 NOTE — Progress Notes (Signed)
 Complete physical exam  Patient: Lauren Robertson   DOB: 1986/01/11   38 y.o. Female  MRN: [id number]  Subjective:    Chief Complaint  Patient presents with   Establish Care    Would lik elabs drawn as well     Lauren Robertson is a 38 y.o. female who presents today for a complete physical exam. She reports consuming a general diet. She generally feels well. She reports sleeping well. She does not have additional problems to discuss today.   Discussed the use of AI scribe software for clinical note transcription with the patient, who gave verbal consent to proceed.  History of Present Illness Lauren Robertson is a 38 year old female who presents for lab work required by her insurance and evaluation of a neck lipoma.  She has a lipoma on the back of her neck, previously evaluated with an ultrasound. Initially, she decided against removal, but it has started to bother her again, particularly on workdays when she is documenting and looking down. She describes the sensation as stiffness and tiredness in her neck, which she suspects might be related to the lipoma or possibly arthritis.  She has a history of arthritis in her shoulders, for which she takes diclofenac once daily, although it is prescribed twice daily by her orthopedic doctor. If she does not take her medication, she is unable to lift weights, which is part of her regular exercise routine. No stomach issues despite the daily NSAID use.  She previously used GLP-1 medications for two months over the summer to aid in weight loss, successfully losing the last 20 pounds of baby weight. She has maintained her weight since stopping the medication.  She has an IUD for birth control and her gynecologist manages her cervical cancer screening. No issues with mood, sleep, hearing, vision, or alcohol use. 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    Most recent fall risk assessment:    05/23/2023   10:29 AM  Fall Risk   Falls in the past year? 0  Number falls in past yr: 0  Injury with Fall? 0  Risk for fall due to : No Fall Risks  Follow up Falls evaluation completed     Most recent depression screenings:    06/24/2024    8:09 AM 05/23/2023   10:30 AM  PHQ 2/9 Scores  PHQ - 2 Score 0 0  PHQ- 9 Score 0 0    Past Medical History:  Diagnosis Date   Frozen shoulder    Right   HELLP syndrome (HELLP), third trimester    Medical history non-contributory    Postpartum care following cesarean delivery (4/3) 12/13/2015      Patient Care Team: Jerrell Cleatus Ned, MD as PCP - General (Internal Medicine) Obgyn, Anna Beers, [facility] (Specialist)   Outpatient Medications Prior to Visit  Medication Sig   diclofenac (VOLTAREN) 75 MG EC tablet Take 75 mg by mouth 2 (two) times daily.   [DISCONTINUED] meloxicam (MOBIC) 15 MG tablet Take 15 mg by mouth daily. (Patient not taking: Reported on 06/24/2024)   No facility-administered medications prior to visit.       Objective:     BP (!) 105/58   Pulse 78   Ht 5' 2.5 (1.588 m)   Wt 148 lb (67.1 kg)   SpO2 100%   BMI 26.64 kg/m   Physical Exam  Gen: Well-appearing woman Skin: On the left side of her posterior neck, superficial to the trapezius, there is about a 2 cm soft tissue mass, diminutive, freely mobile, no fluctuance, consistent with previously seen lipoma on ultrasound Eyes: Normal Ears: Normal tympanic membranes bilaterally Mouth: Normal oropharynx Neck: Normal thyroid , no no nodules or adenopathy Heart: Regular, no murmur Lungs: Unlabored, clear throughout Abd: Soft, nontender, no organomegaly Ext: Warm, no edema, normal joints      Assessment & Plan:    Routine Health Maintenance and Physical Exam  Immunization History  Administered Date(s) Administered   DT (Pediatric) 11/04/2014   Fluzone Influenza virus  vaccine,trivalent (IIV3), split virus 08/10/2010, 09/10/2012, 06/11/2015   Influenza, Quadrivalent, Recombinant, Inj, Pf 06/16/2018   Influenza,inj,Quad PF,6+ Mos 06/08/2019   Tdap 12/06/2015, 07/20/2019    Health Maintenance  Topic Date Due   Hepatitis C Screening  Never done   Hepatitis B Vaccines 19-59 Average Risk (1 of 3 - 19+ 3-dose series) Never done   HPV VACCINES (1 - 3-dose SCDM series) Never done   Influenza Vaccine  12/08/2024 (Originally 04/10/2024)   COVID-19 Vaccine (1 - 2025-26 season) 12/23/2024 (Originally 05/11/2024)   Cervical Cancer Screening (HPV/Pap Cotest)  03/25/2028   DTaP/Tdap/Td (4 - Td or Tdap) 07/19/2029   HIV Screening  Completed   Pneumococcal Vaccine  Aged Out   Meningococcal B Vaccine  Aged Out    Discussed health benefits of physical activity, and encouraged her to engage in regular exercise appropriate for her age and condition.   Problem List Items Addressed This Visit       Medium    Impingement syndrome of right shoulder region (Chronic)   Chronic bilateral shoulder osteoarthritis is managed with diclofenac with [facility]. She takes it once daily. No gastrointestinal issues are reported, but symptoms worsen without medication. Continue diclofenac as prescribed by the orthopedic specialist.  Will check renal function today.      Relevant Medications   diclofenac (VOLTAREN) 75 MG EC tablet     Low   Health maintenance examination - Primary (Chronic)   Routine wellness visit reveals no acute issues. Blood pressure is 105/58 mmHg, and BMI is 26. She exercises regularly, has no alcohol concerns, uses an IUD for birth control, and her cervical cancer screening is up to date. Order labs for triglycerides, glucose, HDL, total cholesterol, LDL, and A1c.      Relevant Orders   Basic metabolic panel with GFR   Lipoma (Chronic)   A lipoma on the posterior neck, previously evaluated with ultrasound, is small and generally painless, though she reports  neck stiffness and discomfort.  The ultrasound showed a 2.6 cm soft tissue structure most consistent with a lipoma.  Patient would like surgical excision.  I have referred her to general surgery.      Relevant Orders   Ambulatory referral to General Surgery   Other Visit Diagnoses       Screening for lipid disorders       Relevant Orders   Lipid panel     Screening for diabetes mellitus       Relevant Orders   Hemoglobin A1c         Cleatus Debby Specking, MD

## 2024-06-24 NOTE — Assessment & Plan Note (Signed)
 Routine wellness visit reveals no acute issues. Blood pressure is 105/58 mmHg, and BMI is 26. She exercises regularly, has no alcohol concerns, uses an IUD for birth control, and her cervical cancer screening is up to date. Order labs for triglycerides, glucose, HDL, total cholesterol, LDL, and A1c.

## 2024-06-25 ENCOUNTER — Ambulatory Visit: Payer: Self-pay | Admitting: Student in an Organized Health Care Education/Training Program

## 2024-10-16 ENCOUNTER — Encounter: Payer: Self-pay | Admitting: Student in an Organized Health Care Education/Training Program
